# Patient Record
Sex: Female | Born: 1970 | Race: White | Hispanic: No | Marital: Married | Smoking: Never smoker
Health system: Southern US, Community
[De-identification: ages and names within clinical notes are randomized; demographics above are authoritative.]

## PROBLEM LIST (undated history)

## (undated) DIAGNOSIS — K219 Gastro-esophageal reflux disease without esophagitis: Secondary | ICD-10-CM

## (undated) DIAGNOSIS — T4145XA Adverse effect of unspecified anesthetic, initial encounter: Secondary | ICD-10-CM

## (undated) DIAGNOSIS — Z9889 Other specified postprocedural states: Secondary | ICD-10-CM

## (undated) DIAGNOSIS — R112 Nausea with vomiting, unspecified: Secondary | ICD-10-CM

## (undated) DIAGNOSIS — T8859XA Other complications of anesthesia, initial encounter: Secondary | ICD-10-CM

## (undated) DIAGNOSIS — L299 Pruritus, unspecified: Secondary | ICD-10-CM

## (undated) DIAGNOSIS — E663 Overweight: Secondary | ICD-10-CM

## (undated) HISTORY — DX: Overweight: E66.3

## (undated) HISTORY — PX: OTHER SURGICAL HISTORY: SHX169

## (undated) HISTORY — DX: Pruritus, unspecified: L29.9

---

## 2000-08-06 ENCOUNTER — Other Ambulatory Visit: Admission: RE | Admit: 2000-08-06 | Discharge: 2000-08-06 | Payer: Self-pay | Admitting: Gynecology

## 2001-08-20 ENCOUNTER — Other Ambulatory Visit: Admission: RE | Admit: 2001-08-20 | Discharge: 2001-08-20 | Payer: Self-pay | Admitting: Gynecology

## 2001-10-15 HISTORY — PX: BREAST ENHANCEMENT SURGERY: SHX7

## 2002-09-09 ENCOUNTER — Other Ambulatory Visit: Admission: RE | Admit: 2002-09-09 | Discharge: 2002-09-09 | Payer: Self-pay | Admitting: Gynecology

## 2004-04-10 ENCOUNTER — Other Ambulatory Visit: Admission: RE | Admit: 2004-04-10 | Discharge: 2004-04-10 | Payer: Self-pay | Admitting: Gynecology

## 2005-05-16 ENCOUNTER — Other Ambulatory Visit: Admission: RE | Admit: 2005-05-16 | Discharge: 2005-05-16 | Payer: Self-pay | Admitting: Gynecology

## 2006-06-03 ENCOUNTER — Other Ambulatory Visit: Admission: RE | Admit: 2006-06-03 | Discharge: 2006-06-03 | Payer: Self-pay | Admitting: Gynecology

## 2006-11-18 ENCOUNTER — Encounter: Admission: RE | Admit: 2006-11-18 | Discharge: 2006-11-18 | Payer: Self-pay | Admitting: Gynecology

## 2007-09-29 ENCOUNTER — Other Ambulatory Visit: Admission: RE | Admit: 2007-09-29 | Discharge: 2007-09-29 | Payer: Self-pay | Admitting: Gynecology

## 2012-08-12 ENCOUNTER — Other Ambulatory Visit: Payer: Self-pay | Admitting: Gynecology

## 2012-08-12 DIAGNOSIS — Z9882 Breast implant status: Secondary | ICD-10-CM

## 2012-08-12 DIAGNOSIS — Z1231 Encounter for screening mammogram for malignant neoplasm of breast: Secondary | ICD-10-CM

## 2012-11-06 ENCOUNTER — Ambulatory Visit
Admission: RE | Admit: 2012-11-06 | Discharge: 2012-11-06 | Disposition: A | Discharge status: Home / Self Care | Payer: 59 | Source: Ambulatory Visit | Attending: Gynecology | Admitting: Gynecology

## 2012-11-06 DIAGNOSIS — Z9882 Breast implant status: Secondary | ICD-10-CM

## 2012-11-06 DIAGNOSIS — Z1231 Encounter for screening mammogram for malignant neoplasm of breast: Secondary | ICD-10-CM

## 2016-06-28 LAB — HM PAP SMEAR: HM Pap smear: NEGATIVE

## 2017-04-16 ENCOUNTER — Ambulatory Visit: Payer: Self-pay | Admitting: Physician Assistant

## 2017-04-16 DIAGNOSIS — Z0189 Encounter for other specified special examinations: Secondary | ICD-10-CM

## 2017-04-19 ENCOUNTER — Encounter: Payer: Self-pay | Admitting: Physician Assistant

## 2017-04-19 ENCOUNTER — Ambulatory Visit (INDEPENDENT_AMBULATORY_CARE_PROVIDER_SITE_OTHER): Payer: 59

## 2017-04-19 ENCOUNTER — Ambulatory Visit (INDEPENDENT_AMBULATORY_CARE_PROVIDER_SITE_OTHER): Payer: 59 | Admitting: Physician Assistant

## 2017-04-19 VITALS — BP 121/84 | HR 91 | Ht 66.0 in | Wt 179.0 lb

## 2017-04-19 DIAGNOSIS — R143 Flatulence: Secondary | ICD-10-CM | POA: Diagnosis not present

## 2017-04-19 DIAGNOSIS — Z13220 Encounter for screening for lipoid disorders: Secondary | ICD-10-CM | POA: Diagnosis not present

## 2017-04-19 DIAGNOSIS — R740 Nonspecific elevation of levels of transaminase and lactic acid dehydrogenase [LDH]: Secondary | ICD-10-CM | POA: Diagnosis not present

## 2017-04-19 DIAGNOSIS — Z7689 Persons encountering health services in other specified circumstances: Secondary | ICD-10-CM | POA: Diagnosis not present

## 2017-04-19 DIAGNOSIS — R5383 Other fatigue: Secondary | ICD-10-CM

## 2017-04-19 DIAGNOSIS — IMO0001 Reserved for inherently not codable concepts without codable children: Secondary | ICD-10-CM | POA: Insufficient documentation

## 2017-04-19 DIAGNOSIS — L299 Pruritus, unspecified: Secondary | ICD-10-CM | POA: Insufficient documentation

## 2017-04-19 DIAGNOSIS — R030 Elevated blood-pressure reading, without diagnosis of hypertension: Secondary | ICD-10-CM | POA: Insufficient documentation

## 2017-04-19 DIAGNOSIS — R0989 Other specified symptoms and signs involving the circulatory and respiratory systems: Secondary | ICD-10-CM

## 2017-04-19 DIAGNOSIS — F458 Other somatoform disorders: Secondary | ICD-10-CM | POA: Diagnosis not present

## 2017-04-19 DIAGNOSIS — R7401 Elevation of levels of liver transaminase levels: Secondary | ICD-10-CM

## 2017-04-19 LAB — CBC
HCT: 42.2 % (ref 35.0–45.0)
Hemoglobin: 14.1 g/dL (ref 11.7–15.5)
MCH: 30.9 pg (ref 27.0–33.0)
MCHC: 33.4 g/dL (ref 32.0–36.0)
MCV: 92.3 fL (ref 80.0–100.0)
MPV: 10.4 fL (ref 7.5–12.5)
PLATELETS: 345 10*3/uL (ref 140–400)
RBC: 4.57 MIL/uL (ref 3.80–5.10)
RDW: 13 % (ref 11.0–15.0)
WBC: 8.1 10*3/uL (ref 3.8–10.8)

## 2017-04-19 NOTE — Patient Instructions (Addendum)
-   Plan to go downstairs today for labs and x-ray today. We will contact you within 3-5 days with your results, or sooner if anything requires immediate attention  For your blood pressure: - Check blood pressure at home for the next 2 weeks - Check around the same time each day in a relaxed setting - Limit salt. Follow DASH eating plan - Follow-up in 2 weeks. Bring your BP log and monitor - Avoid stimulants, including phentermine  Sleep Hygiene . Limiting daytime naps to 30 minutes . Napping does not make up for inadequate nighttime sleep. However, a short nap of 20-30 minutes can help to improve mood, alertness and performance.  . Avoiding stimulants such as  caffeine and phentermine and nicotine close to bedtime.  And when it comes to alcohol, moderation is key 4. While alcohol is well-known to help you fall asleep faster, too much close to bedtime can disrupt sleep in the second half of the night as the body begins to process the alcohol.    . Exercising to promote good quality sleep.  As little as 10 minutes of aerobic exercise, such as walking or cycling, can drastically improve nighttime sleep quality.  For the best night's sleep, most people should avoid strenuous workouts close to bedtime. However, the effect of intense nighttime exercise on sleep differs from person to person, so find out what works best for you.   . Steering clear of food that can be disruptive right before sleep.   Heavy or rich foods, fatty or fried meals, spicy dishes, citrus fruits, and carbonated drinks can trigger indigestion for some people. When this occurs close to bedtime, it can lead to painful heartburn that disrupts sleep. . Ensuring adequate exposure to natural light.  This is particularly important for individuals who may not venture outside frequently. Exposure to sunlight during the day, as well as darkness at night, helps to maintain a healthy sleep-wake cycle . Marland Kitchen Establishing a regular relaxing bedtime  routine.  A regular nightly routine helps the body recognize that it is bedtime. This could include taking warm shower or bath, reading a book, or light stretches. When possible, try to avoid emotionally upsetting conversations and activities before attempting to sleep. . Making sure that the sleep environment is pleasant.  Mattress and pillows should be comfortable. The bedroom should be cool - between 60 and 67 degrees - for optimal sleep. Bright light from lamps, cell phone and TV screens can make it difficult to fall asleep4, so turn those light off or adjust them when possible. Consider using blackout curtains, eye shades, ear plugs, "white noise" machines, humidifiers, fans and other devices that can make the bedroom more relaxing.

## 2017-04-19 NOTE — Progress Notes (Signed)
HPI:                                                                Desiree Price is a 46 y.o. female who presents to [place]: [place] today to establish care  Current Concerns include globus sensation, fatigue  Reports intermittently feels like food is getting stuck in her lower throat. This has been going on for a few months. Denies dyspepsia, dysphagia, belching, regurgitation or aspiration.  Pruritis: reports chronic itching of her arms. Worse with heat. Followed by dermatology. Has been prescribed Gabapentin, but does not take it. Takes Benadryl daily.  Overweight: takes Phentermine intermittently. Prescribed by Bariatric clinic.  Fatigue: patient reports feeling more tired than her baseline lately. She currently sleeps about 4-5 hours per night, going to bed between 11p-12a and waking at 4a for work during the week. 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  Health Maintenance Health Maintenance  Topic Date Due  . HIV Screening  04/13/1985  . TETANUS/TDAP  04/13/1989  . PAP SMEAR  04/14/1991  . INFLUENZA VACCINE  05/15/2017    GYN/Sexual Health  Menstrual status: having periods  LMP: 03/29/17  Menses: regular  Last pap smear: 05/26/15, normal per patient, Lorenda Ishihara, NP [facility]  Sexually active: yes  Current contraception: no   Past Medical History:  Diagnosis Date  . Overweight   . Pruritus    Past Surgical History:  Procedure Laterality Date  . BREAST ENHANCEMENT SURGERY  2003   Social History  Substance Use Topics  . Smoking status: Never Smoker  . Smokeless tobacco: Never Used  . Alcohol use Yes   family history includes CAD in her father; Diabetes in her father, maternal grandfather, paternal grandmother, and sister; Heart disease in her father; Hyperlipidemia in her father; Hypertension in her father, mother, and sister.  ROS: Review of Systems  Constitutional: Positive for  malaise/fatigue. Negative for chills, fever and weight loss.  HENT: Negative.   Respiratory: Negative.   Cardiovascular: Negative.   Gastrointestinal: Positive for heartburn and nausea.  Genitourinary: Negative.   Musculoskeletal: Negative.   Skin: Negative.   Neurological: Positive for dizziness (intermittent w/vigorous exercise) and headaches.  Psychiatric/Behavioral: The patient has insomnia.      Medications: Current Outpatient Prescriptions  Medication Sig Dispense Refill  . Diphenhydramine-Pseudoephed (BENADRYL ALLERGY/SINUS PO) Take by mouth as needed.     . phentermine (ADIPEX-P) 37.5 MG tablet Take 37.5 mg by mouth daily before breakfast.     No current facility-administered medications for this visit.    Allergies  Allergen Reactions  . No Known Allergies        Objective:  BP 121/84   Pulse 91   Ht 5\' 6"  (1.676 m)   Wt 179 lb (81.2 kg)   LMP 03/29/2017   BMI 28.89 kg/m  Gen: well-groomed, cooperative, not ill-appearing, no distress HEENT: normal conjunctiva, oropharynx clear, uvula midline, moist mucus membranes, no thyromegaly or tenderness Pulm: Normal work of breathing, normal phonation, clear to auscultation bilaterally CV: Normal rate, regular rhythm, s1 and s2 distinct, no murmurs, clicks or rubs, no carotid bruit GI: abdomen soft, nondistended, nontender, no masses Neuro: alert and oriented x 3, EOM's intact, PERRLA, normal tone, no tremor  MSK: extremities atraumatic, normal gait and station, no peripheral edema Skin: warm and dry, no rashes or lesions on exposed skin Psych: anxious affect, euthymic mood, normal speech and thought content     Depression screen [facility] 2/9 04/19/2017  Decreased Interest 0  Down, Depressed, Hopeless 0  PHQ - 2 Score 0  Altered sleeping 3  Tired, decreased energy 1  Change in appetite 0  Feeling bad or failure about yourself  0  Trouble concentrating 0  Moving slowly or fidgety/restless 0  Suicidal thoughts 0   PHQ-9 Score 4     Assessment and Plan: 46 y.o. female with   1. Encounter to establish care - reviewed PMH - negative PHQ2/depression screen  2. Pruritus - cont to follow-up with dermatology - cont Benadryl prn - checking liver enzymes today  3. Fatigue, unspecified type - CBC - Comprehensive metabolic panel - TSH  4. Encounter for screening for lipid disorder - Lipid Panel w/reflex Direct LDL  5. Globus sensation - DG Abd Acute W/Chest; Future  6. Elevated blood pressure reading - patient to monitor BP's at home x 2 weeks and log readings - DASH eating plan - avoid stimulants, including Phentermine   Patient education and anticipatory guidance given Patient agrees with treatment plan Follow-up in 2 weeks for blood pressure or sooner as needed  Darlyne Russian PA-C

## 2017-04-20 LAB — COMPREHENSIVE METABOLIC PANEL
ALT: 37 U/L — ABNORMAL HIGH (ref 6–29)
AST: 23 U/L (ref 10–35)
Albumin: 4.4 g/dL (ref 3.6–5.1)
Alkaline Phosphatase: 83 U/L (ref 33–115)
BUN: 13 mg/dL (ref 7–25)
CALCIUM: 9.7 mg/dL (ref 8.6–10.2)
CHLORIDE: 103 mmol/L (ref 98–110)
CO2: 25 mmol/L (ref 20–31)
Creat: 1.08 mg/dL (ref 0.50–1.10)
GLUCOSE: 87 mg/dL (ref 65–99)
POTASSIUM: 4.7 mmol/L (ref 3.5–5.3)
Sodium: 139 mmol/L (ref 135–146)
TOTAL PROTEIN: 7 g/dL (ref 6.1–8.1)
Total Bilirubin: 0.4 mg/dL (ref 0.2–1.2)

## 2017-04-20 LAB — LIPID PANEL W/REFLEX DIRECT LDL
CHOL/HDL RATIO: 3.8 ratio (ref <5.0)
Cholesterol: 223 mg/dL — ABNORMAL HIGH (ref <200)
HDL: 59 mg/dL (ref >50)
LDL-Cholesterol: 137 mg/dL — ABNORMAL HIGH
Non-HDL Cholesterol (Calc): 164 mg/dL — ABNORMAL HIGH (ref <130)
TRIGLYCERIDES: 141 mg/dL (ref <150)

## 2017-04-20 LAB — TSH: TSH: 1.05 m[IU]/L

## 2017-04-21 ENCOUNTER — Encounter: Payer: Self-pay | Admitting: Physician Assistant

## 2017-04-21 DIAGNOSIS — R5383 Other fatigue: Secondary | ICD-10-CM | POA: Insufficient documentation

## 2017-04-22 ENCOUNTER — Telehealth: Payer: Self-pay | Admitting: *Deleted

## 2017-04-22 ENCOUNTER — Encounter: Payer: Self-pay | Admitting: Physician Assistant

## 2017-04-22 DIAGNOSIS — R7401 Elevation of levels of liver transaminase levels: Secondary | ICD-10-CM | POA: Insufficient documentation

## 2017-04-22 DIAGNOSIS — R740 Nonspecific elevation of levels of transaminase and lactic acid dehydrogenase [LDH]: Secondary | ICD-10-CM

## 2017-04-22 DIAGNOSIS — E7800 Pure hypercholesterolemia, unspecified: Secondary | ICD-10-CM | POA: Insufficient documentation

## 2017-04-22 NOTE — Telephone Encounter (Signed)
Patient should trial 1 month of over-the-counter Omeprazole (Prilosec) 20mg  daily. Follow-up in 1 month

## 2017-04-22 NOTE — Addendum Note (Signed)
Addended by: Nelson Chimes E on: 04/22/2017 10:50 AM   Modules accepted: Orders

## 2017-04-22 NOTE — Telephone Encounter (Signed)
Patient wanted Evlyn Clines to know that after she thought about it she does feel like she has been burping a lot more and does still feel nauseous. She wonders if she may have GERD. Please advise

## 2017-04-22 NOTE — Telephone Encounter (Signed)
Pt informed of recommendations.Desiree Price Lynetta  

## 2017-04-22 NOTE — Progress Notes (Signed)
Your labs and x-ray look good overall - there is no evidence of a hernia or abnormality in the chest - cholesterol is borderline high. Continue to exercise regularly and limit saturated fats. DASH eating plan. - liver enzyme is mildly elevated. This is most likely a little fat in the liver. We will recheck it in 4 weeks  Otherwise normal - normal kidney function - normal blood counts - no evidence of diabetes - no evidence of thyroid disease

## 2017-04-24 ENCOUNTER — Telehealth: Payer: Self-pay | Admitting: *Deleted

## 2017-04-24 ENCOUNTER — Encounter: Payer: Self-pay | Admitting: Physician Assistant

## 2017-04-24 ENCOUNTER — Other Ambulatory Visit: Payer: Self-pay | Admitting: *Deleted

## 2017-04-24 DIAGNOSIS — R7401 Elevation of levels of liver transaminase levels: Secondary | ICD-10-CM

## 2017-04-24 DIAGNOSIS — R740 Nonspecific elevation of levels of transaminase and lactic acid dehydrogenase [LDH]: Principal | ICD-10-CM

## 2017-04-24 NOTE — Telephone Encounter (Signed)
Patient called yesterday stating she went to [place] (urgent care?) and wanted a doctor her to review the progress notes she received from them. From the message she left she may be wanting a second opinion. Called the patient and left a vm stating she would need to call and schedule an appointment if she saw another provider and wanted [place] to give another opinion and if she is still not feeling well.

## 2017-04-25 ENCOUNTER — Encounter: Payer: Self-pay | Admitting: Physician Assistant

## 2017-04-25 LAB — COMPREHENSIVE METABOLIC PANEL
ALT: 63 U/L — ABNORMAL HIGH (ref 6–29)
AST: 33 U/L (ref 10–35)
Albumin: 4.2 g/dL (ref 3.6–5.1)
Alkaline Phosphatase: 91 U/L (ref 33–115)
BUN: 11 mg/dL (ref 7–25)
CHLORIDE: 103 mmol/L (ref 98–110)
CO2: 23 mmol/L (ref 20–31)
Calcium: 9.2 mg/dL (ref 8.6–10.2)
Creat: 0.91 mg/dL (ref 0.50–1.10)
GLUCOSE: 87 mg/dL (ref 65–99)
POTASSIUM: 4 mmol/L (ref 3.5–5.3)
Sodium: 137 mmol/L (ref 135–146)
Total Bilirubin: 0.3 mg/dL (ref 0.2–1.2)
Total Protein: 6.7 g/dL (ref 6.1–8.1)

## 2017-04-25 NOTE — Progress Notes (Signed)
Lab cannot be added. Pt instructed to return to lab for further testing.

## 2017-04-25 NOTE — Progress Notes (Signed)
Hi Desiree Price,  Good news, those prior lab abnormalities were not present on your repeat CMP. All of your electrolytes, including potassium, are in a normal range.  Your ALT, which is a liver enzyme, is a little elevated. I am going to order a little more blood work and an ultrasound of your liver. Most often the cause of this is just some fat that has infiltrated the liver and the treatment for that is heart-healthy diet and exercise to achieve a healthy body weight.  Desiree Price

## 2017-05-01 ENCOUNTER — Encounter: Payer: Self-pay | Admitting: Physician Assistant

## 2017-05-03 ENCOUNTER — Ambulatory Visit (INDEPENDENT_AMBULATORY_CARE_PROVIDER_SITE_OTHER): Payer: 59 | Admitting: Physician Assistant

## 2017-05-03 ENCOUNTER — Encounter: Payer: Self-pay | Admitting: Physician Assistant

## 2017-05-03 VITALS — BP 132/86 | HR 99 | Ht 66.0 in | Wt 186.0 lb

## 2017-05-03 DIAGNOSIS — R740 Nonspecific elevation of levels of transaminase and lactic acid dehydrogenase [LDH]: Secondary | ICD-10-CM

## 2017-05-03 DIAGNOSIS — M79671 Pain in right foot: Secondary | ICD-10-CM

## 2017-05-03 DIAGNOSIS — M79672 Pain in left foot: Secondary | ICD-10-CM | POA: Diagnosis not present

## 2017-05-03 DIAGNOSIS — I10 Essential (primary) hypertension: Secondary | ICD-10-CM | POA: Diagnosis not present

## 2017-05-03 DIAGNOSIS — M722 Plantar fascial fibromatosis: Secondary | ICD-10-CM | POA: Insufficient documentation

## 2017-05-03 DIAGNOSIS — R7401 Elevation of levels of liver transaminase levels: Secondary | ICD-10-CM

## 2017-05-03 NOTE — Patient Instructions (Addendum)
For blood pressure: - continue to monitor intermittently - DASH eating plan - at least 3 days per week of moderate intensity cardio - follow-up in 6 months   Schedule your ultrasound [phone] (Imaging Dept on the 1st floor)  For your feet: - rehab exercises daily - avoid jumping and high impact activities that jar your heel - make an appointment with Sports Medicine for custom orthotics. Bring your work Copy  DASH Eating Plan DASH stands for "Dietary Approaches to Stop Hypertension." The DASH eating plan is a healthy eating plan that has been shown to reduce high blood pressure (hypertension). It may also reduce your risk for type 2 diabetes, heart disease, and stroke. The DASH eating plan may also help with weight loss. What are tips for following this plan? General guidelines  Avoid eating more than 2,300 mg (milligrams) of salt (sodium) a day. If you have hypertension, you may need to reduce your sodium intake to 1,500 mg a day.  Limit alcohol intake to no more than 1 drink a day for nonpregnant women and 2 drinks a day for men. One drink equals 12 oz of beer, 5 oz of wine, or 1 oz of hard liquor.  Work with your health care provider to maintain a healthy body weight or to lose weight. Ask what an ideal weight is for you.  Get at least 30 minutes of exercise that causes your heart to beat faster (aerobic exercise) most days of the week. Activities may include walking, swimming, or biking.  Work with your health care provider or diet and nutrition specialist (dietitian) to adjust your eating plan to your individual calorie needs. Reading food labels  Check food labels for the amount of sodium per serving. Choose foods with less than 5 percent of the Daily Value of sodium. Generally, foods with less than 300 mg of sodium per serving fit into this eating plan.  To find whole grains, look for the word "whole" as the first word in the ingredient  list. Shopping  Buy products labeled as "low-sodium" or "no salt added."  Buy fresh foods. Avoid canned foods and premade or frozen meals. Cooking  Avoid adding salt when cooking. Use salt-free seasonings or herbs instead of table salt or sea salt. Check with your health care provider or pharmacist before using salt substitutes.  Do not fry foods. Cook foods using healthy methods such as baking, boiling, grilling, and broiling instead.  Cook with heart-healthy oils, such as olive, canola, soybean, or sunflower oil. Meal planning   Eat a balanced diet that includes: ? 5 or more servings of fruits and vegetables each day. At each meal, try to fill half of your plate with fruits and vegetables. ? Up to 6-8 servings of whole grains each day. ? Less than 6 oz of lean meat, poultry, or fish each day. A 3-oz serving of meat is about the same size as a deck of cards. One egg equals 1 oz. ? 2 servings of low-fat dairy each day. ? A serving of nuts, seeds, or beans 5 times each week. ? Heart-healthy fats. Healthy fats called Omega-3 fatty acids are found in foods such as flaxseeds and coldwater fish, like sardines, salmon, and mackerel.  Limit how much you eat of the following: ? Canned or prepackaged foods. ? Food that is high in trans fat, such as fried foods. ? Food that is high in saturated fat, such as fatty meat. ? Sweets, desserts, sugary drinks, and other  foods with added sugar. ? Full-fat dairy products.  Do not salt foods before eating.  Try to eat at least 2 vegetarian meals each week.  Eat more home-cooked food and less restaurant, buffet, and fast food.  When eating at a restaurant, ask that your food be prepared with less salt or no salt, if possible. What foods are recommended? The items listed may not be a complete list. Talk with your dietitian about what dietary choices are best for you. Grains Whole-grain or whole-wheat bread. Whole-grain or whole-wheat pasta. Brown  rice. [place]. Bulgur. Whole-grain and low-sodium cereals. Pita bread. Low-fat, low-sodium crackers. Whole-wheat flour tortillas. Vegetables Fresh or frozen vegetables (raw, steamed, roasted, or grilled). Low-sodium or reduced-sodium tomato and vegetable juice. Low-sodium or reduced-sodium tomato sauce and tomato paste. Low-sodium or reduced-sodium canned vegetables. Fruits All fresh, dried, or frozen fruit. Canned fruit in natural juice (without added sugar). Meat and other protein foods Skinless chicken or [place]. Ground chicken or [place]. Pork with fat trimmed off. Fish and seafood. Egg whites. Dried beans, peas, or lentils. Unsalted nuts, nut butters, and seeds. Unsalted canned beans. Lean cuts of beef with fat trimmed off. Low-sodium, lean deli meat. Dairy Low-fat (1%) or fat-free (skim) milk. Fat-free, low-fat, or reduced-fat cheeses. Nonfat, low-sodium ricotta or cottage cheese. Low-fat or nonfat yogurt. Low-fat, low-sodium cheese. Fats and oils Soft margarine without trans fats. Vegetable oil. Low-fat, reduced-fat, or light mayonnaise and salad dressings (reduced-sodium). Canola, safflower, olive, soybean, and sunflower oils. Avocado. Seasoning and other foods Herbs. Spices. Seasoning mixes without salt. Unsalted popcorn and pretzels. Fat-free sweets. What foods are not recommended? The items listed may not be a complete list. Talk with your dietitian about what dietary choices are best for you. Grains Baked goods made with fat, such as croissants, muffins, or some breads. Dry pasta or rice meal packs. Vegetables Creamed or fried vegetables. Vegetables in a cheese sauce. Regular canned vegetables (not low-sodium or reduced-sodium). Regular canned tomato sauce and paste (not low-sodium or reduced-sodium). Regular tomato and vegetable juice (not low-sodium or reduced-sodium). Angie Fava. Olives. Fruits Canned fruit in a light or heavy syrup. Fried fruit. Fruit in cream or butter  sauce. Meat and other protein foods Fatty cuts of meat. Ribs. Fried meat. Berniece Salines. Sausage. Bologna and other processed lunch meats. Salami. Fatback. Hotdogs. Bratwurst. Salted nuts and seeds. Canned beans with added salt. Canned or smoked fish. Whole eggs or egg yolks. Chicken or [place] with skin. Dairy Whole or 2% milk, cream, and half-and-half. Whole or full-fat cream cheese. Whole-fat or sweetened yogurt. Full-fat cheese. Nondairy creamers. Whipped toppings. Processed cheese and cheese spreads. Fats and oils Butter. Stick margarine. Lard. Shortening. Ghee. Bacon fat. Tropical oils, such as coconut, palm kernel, or palm oil. Seasoning and other foods Salted popcorn and pretzels. Onion salt, garlic salt, seasoned salt, table salt, and sea salt. Worcestershire sauce. Tartar sauce. Barbecue sauce. Teriyaki sauce. Soy sauce, including reduced-sodium. Steak sauce. Canned and packaged gravies. Fish sauce. Oyster sauce. Cocktail sauce. Horseradish that you find on the shelf. Ketchup. Mustard. Meat flavorings and tenderizers. Bouillon cubes. Hot sauce and Tabasco sauce. Premade or packaged marinades. Premade or packaged taco seasonings. Relishes. Regular salad dressings. Where to find more information:  National Heart, Lung, and [place]: https://wilson-eaton.com/  American Heart Association: www.heart.org Summary  The DASH eating plan is a healthy eating plan that has been shown to reduce high blood pressure (hypertension). It may also reduce your risk for type 2 diabetes, heart disease, and stroke.  With  the DASH eating plan, you should limit salt (sodium) intake to 2,300 mg a day. If you have hypertension, you may need to reduce your sodium intake to 1,500 mg a day.  When on the DASH eating plan, aim to eat more fresh fruits and vegetables, whole grains, lean proteins, low-fat dairy, and heart-healthy fats.  Work with your health care provider or diet and nutrition specialist (dietitian) to adjust  your eating plan to your individual calorie needs. This information is not intended to replace advice given to you by your health care provider. Make sure you discuss any questions you have with your health care provider. Document Released: 09/20/2011 Document Revised: 09/24/2016 Document Reviewed: 09/24/2016 Elsevier Interactive Patient Education  2017 [employer].

## 2017-05-03 NOTE — Progress Notes (Signed)
HPI:                                                                Desiree Price is a 46 y.o. female who presents to [place]: [place] today for blood pressure follow-up  Patient has been monitoring BP's at home. BP range 120's-130's/80's. Denies vision change, headache, chest pain with exertion, orthopnea, lightheadedness, syncope and edema. Risk factors include: obesity  Elevated ALT: denies fatigue, malaise, RUQ pain  Patient also complains of bilateral heel pain present for over 6 months. States it is worse in the morning when she first puts her feet down and gradually improves throughout the day, but never goes away. Pain is moderate, persistent. Patient works on her feet daily for [employer].  Past Medical History:  Diagnosis Date  . Overweight   . Pruritus    Past Surgical History:  Procedure Laterality Date  . BREAST ENHANCEMENT SURGERY  2003   Social History  Substance Use Topics  . Smoking status: Never Smoker  . Smokeless tobacco: Never Used  . Alcohol use Yes   family history includes CAD in her father; Diabetes in her father, maternal grandfather, paternal grandmother, and sister; Heart disease in her father; Hyperlipidemia in her father; Hypertension in her father, mother, and sister.  ROS: negative except as noted in the HPI  Medications: Current Outpatient Prescriptions  Medication Sig Dispense Refill  . Diphenhydramine-Pseudoephed (BENADRYL ALLERGY/SINUS PO) Take by mouth as needed.     . phentermine (ADIPEX-P) 37.5 MG tablet Take 37.5 mg by mouth daily before breakfast.     No current facility-administered medications for this visit.    Allergies  Allergen Reactions  . No Known Allergies        Objective:  BP 132/86   Pulse 99   Ht 5\' 6"  (1.676 m)   Wt 186 lb (84.4 kg)   BMI 30.02 kg/m  Gen: well-groomed, cooperative, not ill-appearing, no distress HEENT: normal conjunctiva, trachea midline Pulm: Normal  work of breathing, normal phonation, clear to auscultation bilaterally, no wheezes, rales or rhonchi CV: Normal rate, regular rhythm, s1 and s2 distinct, no murmurs, clicks or rubs  Neuro: alert and oriented x 3, EOM's intact, no tremor MSK: extremities atraumatic, normal gait and station, no peripheral edema Skin: warm, dry, intact; no rashes on exposed skin, no jaundice   No results found for this or any previous visit (from the past 72 hour(s)). No results found.    Assessment and Plan: 46 y.o. female with  1. Essential hypertension - Stage I hypertension, 10-yr ASCVD risk <10% - cont therapeutic lifestyle changes - will continue to monitor and reassess need for medication every 6 months  2. Elevated ALT measurement - most likely NAFL given obesity and low risk behaviors for hepatitis - RUQ ultrasound pending - acute hepatitis panel pending  3. Heel pain, bilateral - differential includes plantar fasciitis  - rehab exercises daily - follow-up with Sports Medicine for orthotics  Patient education and anticipatory guidance given Patient agrees with treatment plan Follow-up in 6 months or sooner as needed   Darlyne Russian PA-C

## 2017-05-04 LAB — HEPATITIS PANEL, ACUTE
HCV AB: NEGATIVE
Hep A IgM: NONREACTIVE
Hep B C IgM: NONREACTIVE
Hepatitis B Surface Ag: NEGATIVE

## 2017-05-05 ENCOUNTER — Encounter: Payer: Self-pay | Admitting: Physician Assistant

## 2017-05-06 ENCOUNTER — Ambulatory Visit (INDEPENDENT_AMBULATORY_CARE_PROVIDER_SITE_OTHER): Payer: 59

## 2017-05-06 DIAGNOSIS — K8020 Calculus of gallbladder without cholecystitis without obstruction: Secondary | ICD-10-CM

## 2017-05-06 DIAGNOSIS — R740 Nonspecific elevation of levels of transaminase and lactic acid dehydrogenase [LDH]: Principal | ICD-10-CM

## 2017-05-06 DIAGNOSIS — R7401 Elevation of levels of liver transaminase levels: Secondary | ICD-10-CM

## 2017-05-06 NOTE — Progress Notes (Signed)
Hi Bertie,  Good news: your ultrasound showed a normal liver.  It also showed stones in your gallbladder. These are fairly common and do not tend to cause symptoms unless they get lodged in a bile duct.  Otherwise, everything looks normal.  Best, Evlyn Clines

## 2017-05-16 ENCOUNTER — Encounter: Payer: Self-pay | Admitting: Sports Medicine

## 2017-05-16 ENCOUNTER — Ambulatory Visit (INDEPENDENT_AMBULATORY_CARE_PROVIDER_SITE_OTHER): Payer: 59 | Admitting: Sports Medicine

## 2017-05-16 DIAGNOSIS — M722 Plantar fascial fibromatosis: Secondary | ICD-10-CM | POA: Diagnosis not present

## 2017-05-16 NOTE — Assessment & Plan Note (Signed)
Custom orthotics as above, meloxicam, rehabilitation exercises given. Return to see me in one month.

## 2017-05-16 NOTE — Progress Notes (Signed)
   Subjective:    I'm seeing this patient as a consultation for:  Nelson Chimes, PA-C  CC:  Right foot pain  HPI: This is a pleasant 46 year old female, she works for [employer], spends all day on her feet. She is not allowed to wear athletic shoes at work. The past several weeks she's had increasing pain on the plantar aspect of her right heel, worse with first few steps in the morning and at the end of the day, localized without radiation.  Past medical history, Surgical history, Family history not pertinant except as noted below, Social history, Allergies, and medications have been entered into the medical record, reviewed, and no changes needed.   Review of Systems: No headache, visual changes, nausea, vomiting, diarrhea, constipation, dizziness, abdominal pain, skin rash, fevers, chills, night sweats, weight loss, swollen lymph nodes, body aches, joint swelling, muscle aches, chest pain, shortness of breath, mood changes, visual or auditory hallucinations.   Objective:   General: Well Developed, well nourished, and in no acute distress.  Neuro:  Extra-ocular muscles intact, able to move all 4 extremities, sensation grossly intact.  Deep tendon reflexes tested were normal. Psych: Alert and oriented, mood congruent with affect. ENT:  Ears and nose appear unremarkable.  Hearing grossly normal. Neck: Unremarkable overall appearance, trachea midline.  No visible thyroid enlargement. Eyes: Conjunctivae and lids appear unremarkable.  Pupils equal and round. Skin: Warm and dry, no rashes noted.  Cardiovascular: Pulses palpable, no extremity edema. Right Foot: No visible erythema or swelling. Range of motion is full in all directions. Strength is 5/5 in all directions. No hallux valgus. Pes cavus No abnormal callus noted. No pain over the navicular prominence, or base of fifth metatarsal. Moderate tenderness to palpation of the calcaneal insertion of plantar fascia. No pain at the Achilles  insertion. No pain over the calcaneal bursa. No pain of the retrocalcaneal bursa. No tenderness to palpation over the tarsals, metatarsals, or phalanges. No hallux rigidus or limitus. No tenderness palpation over interphalangeal joints. No pain with compression of the metatarsal heads. Neurovascularly intact distally.  Patient was fitted for a : standard, cushioned, semi-rigid orthotic. The orthotic was heated and afterward the patient stood on the orthotic blank positioned on the orthotic stand. The patient was positioned in subtalar neutral position and 10 degrees of ankle dorsiflexion in a weight bearing stance. After completion of molding, a stable base was applied to the orthotic blank. The blank was ground to a stable position for weight bearing. Size: 9 Base: White Health and safety inspector and Padding: None The patient ambulated these, and they were very comfortable.  Impression and Recommendations:   This case required medical decision making of moderate complexity.  Plantar fasciitis, right Custom orthotics as above, meloxicam, rehabilitation exercises given. Return to see me in one month.

## 2017-06-13 ENCOUNTER — Ambulatory Visit: Payer: 59 | Admitting: Sports Medicine

## 2017-06-13 DIAGNOSIS — Z0189 Encounter for other specified special examinations: Secondary | ICD-10-CM

## 2017-07-08 ENCOUNTER — Encounter: Payer: Self-pay | Admitting: Physician Assistant

## 2017-07-08 DIAGNOSIS — R112 Nausea with vomiting, unspecified: Secondary | ICD-10-CM

## 2017-07-23 ENCOUNTER — Ambulatory Visit: Payer: Self-pay | Admitting: Surgery

## 2017-07-24 NOTE — Patient Instructions (Addendum)
Desiree Price  07/24/2017   Your procedure is scheduled on: 07/30/17  Report to [facility] Main  Entrance Take [place]  elevators to 3rd floor to  [place] at    [phone].     Call this number if you have problems the morning of surgery [phone]    Remember: ONLY 1 PERSON MAY GO WITH YOU TO SHORT STAY TO GET  READY MORNING OF YOUR SURGERY.  Do not eat food or drink liquids :After Midnight.     Take these medicines the morning of surgery with A SIP OF WATER: NONE                                You may not have any metal on your body including hair pins and              piercings  Do not wear jewelry, make-up, lotions, powders or perfumes, deodorant             Do not wear nail polish.  Do not shave  48 hours prior to surgery.              Do not bring valuables to the hospital. [place].  Contacts, dentures or bridgework may not be worn into surgery.      Patients discharged the Desiree of surgery will not be allowed to drive home.  Name and phone number of your driver:  Special Instructions: N/A              Please read over the following fact sheets you were given: _____________________________________________________________________         [facility] - Preparing for Surgery Before surgery, you can play an important role.  Because skin is not sterile, your skin needs to be as free of germs as possible.  You can reduce the number of germs on your skin by washing with CHG (chlorahexidine gluconate) soap before surgery.  CHG is an antiseptic cleaner which kills germs and bonds with the skin to continue killing germs even after washing. Please DO NOT use if you have an allergy to CHG or antibacterial soaps.  If your skin becomes reddened/irritated stop using the CHG and inform your nurse when you arrive at Short Stay. Do not shave (including legs and underarms) for at least 48 hours prior to the first  CHG shower.  You may shave your face/neck. Please follow these instructions carefully:  1.  Shower with CHG Soap the night before surgery and the  morning of Surgery.  2.  If you choose to wash your hair, wash your hair first as usual with your  normal  shampoo.  3.  After you shampoo, rinse your hair and body thoroughly to remove the  shampoo.                           4.  Use CHG as you would any other liquid soap.  You can apply chg directly  to the skin and wash                       Gently with a scrungie or clean washcloth.  5.  Apply the CHG Soap to your body ONLY FROM THE NECK DOWN.   Do not use on face/ open                           Wound or open sores. Avoid contact with eyes, ears mouth and genitals (private parts).                       Wash face,  Genitals (private parts) with your normal soap.             6.  Wash thoroughly, paying special attention to the area where your surgery  will be performed.  7.  Thoroughly rinse your body with warm water from the neck down.  8.  DO NOT shower/wash with your normal soap after using and rinsing off  the CHG Soap.                9.  Pat yourself dry with a clean towel.            10.  Wear clean pajamas.            11.  Place clean sheets on your bed the night of your first shower and do not  sleep with pets. Desiree of Surgery : Do not apply any lotions/deodorants the morning of surgery.  Please wear clean clothes to the hospital/surgery center.  FAILURE TO FOLLOW THESE INSTRUCTIONS MAY RESULT IN THE CANCELLATION OF YOUR SURGERY PATIENT SIGNATURE_________________________________  NURSE SIGNATURE__________________________________  ________________________________________________________________________    CLEAR LIQUID DIET   Foods Allowed                                                                     Foods Excluded  Coffee and tea, regular and decaf                             liquids that you cannot  Plain Jell-O in any flavor                                              see through such as: Fruit ices (not with fruit pulp)                                     milk, soups, orange juice  Iced Popsicles                                    All solid food Carbonated beverages, regular and diet                                    Cranberry, grape and apple juices Sports drinks like Gatorade Lightly seasoned clear broth or consume(fat free) Sugar, honey syrup  Sample Menu Breakfast  Lunch                                     Supper Cranberry juice                    Beef broth                            Chicken broth Jell-O                                     Grape juice                           Apple juice Coffee or tea                        Jell-O                                      Popsicle                                                Coffee or tea                        Coffee or tea  _____________________________________________________________________

## 2017-07-26 ENCOUNTER — Encounter (HOSPITAL_COMMUNITY): Payer: Self-pay

## 2017-07-26 ENCOUNTER — Encounter (HOSPITAL_COMMUNITY)
Admission: RE | Admit: 2017-07-26 | Discharge: 2017-07-26 | Disposition: A | Discharge status: Home / Self Care | Payer: 59 | Source: Ambulatory Visit | Attending: Surgery | Admitting: Surgery

## 2017-07-26 DIAGNOSIS — Z01810 Encounter for preprocedural cardiovascular examination: Secondary | ICD-10-CM | POA: Insufficient documentation

## 2017-07-26 DIAGNOSIS — Z01812 Encounter for preprocedural laboratory examination: Secondary | ICD-10-CM | POA: Insufficient documentation

## 2017-07-26 HISTORY — DX: Gastro-esophageal reflux disease without esophagitis: K21.9

## 2017-07-26 HISTORY — DX: Adverse effect of unspecified anesthetic, initial encounter: T41.45XA

## 2017-07-26 HISTORY — DX: Other specified postprocedural states: Z98.890

## 2017-07-26 HISTORY — DX: Other complications of anesthesia, initial encounter: T88.59XA

## 2017-07-26 HISTORY — DX: Nausea with vomiting, unspecified: R11.2

## 2017-07-26 LAB — PREGNANCY, URINE: Preg Test, Ur: NEGATIVE

## 2017-07-26 NOTE — Progress Notes (Signed)
EKG requested from [facility] in [place]. They tried several times to fax . Unsuccessful transaction

## 2017-07-26 NOTE — Progress Notes (Signed)
Cbc 07/03/17 care everywhere Cmp 07/22/17 care everywhere  Dg abd w/ chest 04/19/17 epic

## 2017-07-29 NOTE — H&P (Signed)
Desiree Price  07/23/2017 11:41 AM  Location: [facility]  Patient #: [id number]  DOB: 09/24/1971  Undefined / Language: Desiree Price / Race: White  Female      History of Present Illness Rodman Key B. Hassell Done MD; 07/23/2017 12:12 PM)  The patient is a 46 year old female who presents for evaluation of gall stones. [name] (wife of my patient Desiree Price) has been sick for about 5 weeks. 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 This has interfered with her work. She has had nausea with vomiting and reflux symptoms. She has been worked up for a globus sensation and at one point was referred to the [employer]. When she has an attack she has pain, chills and sweats. No prior abdominal surgery except liposuction.     Ultrasound shows gallstones and I think there may be some wall thickening (in July). Scan and report reviewed by me.     I have discussed lap chole IOC with her in detail including complications not limited to common bile duct injury etc. I gave her a pamphlet on this and she would like to get this done as soon as possible. Will schedule at [place] [name], CMA; 07/23/2017 11:41 AM)  No Known Drug Allergies 07/23/2017    Medication History ([name], CMA; 07/23/2017 11:41 AM)  Ondansetron (4MG  Tablet Disint, Oral) Active.  Pantoprazole Sodium (40MG  Tablet DR, Oral) Active.  Medications Reconciled    Vitals ([name] CMA; 07/23/2017 11:41 AM)  07/23/2017 11:41 AM  Weight: 191.6 lb Height: 66in  Body Surface Area: 1.96 m Body Mass Index: 30.92 kg/m   Temp.: 98.77F(Oral)  Pulse: 96 (Regular)   BP: 120/78 (Sitting, Left Arm, Standard)              Physical Exam ([name] B. Hassell Done MD; 07/23/2017 12:07 PM)  General  Note: HEENT no scleral icterus  Neck supple without adenopathy  Chest clear  Heart SR without murmurs  Abdomen nontender at present  Ext FROM   Neuro alert and oriented x 3          Assessment & Plan Rodman Key B. Hassell Done MD; 07/23/2017 12:10 PM)  CHRONIC CHOLECYSTITIS WITH CALCULUS (K80.10)  Impression: Symptoms likely chronic cholecystitis. Will proceed with lap chole at [facility] asap. She understands the reasons for surgery and the risks/benefits. Her mother (my patient) had common duct stones.  Matt B. Hassell Done, MD, FACS

## 2017-07-30 ENCOUNTER — Ambulatory Visit (HOSPITAL_COMMUNITY): Payer: 59 | Admitting: Anesthesiology

## 2017-07-30 ENCOUNTER — Encounter (HOSPITAL_COMMUNITY): Payer: Self-pay | Admitting: *Deleted

## 2017-07-30 ENCOUNTER — Ambulatory Visit (HOSPITAL_COMMUNITY): Payer: 59

## 2017-07-30 ENCOUNTER — Encounter (HOSPITAL_COMMUNITY)
Admission: RE | Disposition: A | Discharge status: Home / Self Care | Payer: Self-pay | Source: Ambulatory Visit | Attending: Surgery

## 2017-07-30 ENCOUNTER — Ambulatory Visit (HOSPITAL_COMMUNITY)
Admission: RE | Admit: 2017-07-30 | Discharge: 2017-07-30 | Disposition: A | Discharge status: Home / Self Care | Payer: 59 | Source: Ambulatory Visit | Attending: Surgery | Admitting: Surgery

## 2017-07-30 DIAGNOSIS — K219 Gastro-esophageal reflux disease without esophagitis: Secondary | ICD-10-CM | POA: Diagnosis not present

## 2017-07-30 DIAGNOSIS — K8010 Calculus of gallbladder with chronic cholecystitis without obstruction: Secondary | ICD-10-CM | POA: Insufficient documentation

## 2017-07-30 DIAGNOSIS — Z79899 Other long term (current) drug therapy: Secondary | ICD-10-CM | POA: Diagnosis not present

## 2017-07-30 HISTORY — PX: CHOLECYSTECTOMY: SHX55

## 2017-07-30 SURGERY — LAPAROSCOPIC CHOLECYSTECTOMY WITH INTRAOPERATIVE CHOLANGIOGRAM
Anesthesia: General | Site: Abdomen

## 2017-07-30 MED ORDER — FENTANYL CITRATE (PF) 100 MCG/2ML IJ SOLN
INTRAMUSCULAR | Status: AC
  Filled 2017-07-30: qty 2

## 2017-07-30 MED ORDER — CHLORHEXIDINE GLUCONATE CLOTH 2 % EX PADS: 6.0000 | MEDICATED_PAD | Freq: Once | CUTANEOUS | Status: DC

## 2017-07-30 MED ORDER — HYDROCODONE-ACETAMINOPHEN 5-325 MG PO TABS: 1.0000 | ORAL_TABLET | ORAL | 0 refills | Status: AC | PRN

## 2017-07-30 MED ORDER — SODIUM CHLORIDE 0.9 % IV SOLN: 250.0000 mL | INTRAVENOUS | Status: DC | PRN

## 2017-07-30 MED ORDER — ROCURONIUM BROMIDE 10 MG/ML (PF) SYRINGE
PREFILLED_SYRINGE | INTRAVENOUS | Status: DC | PRN
  Administered 2017-07-30: 50 mg via INTRAVENOUS

## 2017-07-30 MED ORDER — DEXAMETHASONE SODIUM PHOSPHATE 10 MG/ML IJ SOLN
INTRAMUSCULAR | Status: AC
  Filled 2017-07-30: qty 1

## 2017-07-30 MED ORDER — DEXAMETHASONE SODIUM PHOSPHATE 10 MG/ML IJ SOLN
INTRAMUSCULAR | Status: DC | PRN
  Administered 2017-07-30: 10 mg via INTRAVENOUS

## 2017-07-30 MED ORDER — HEPARIN SODIUM (PORCINE) 5000 UNIT/ML IJ SOLN
5000.0000 [IU] | Freq: Once | INTRAMUSCULAR | Status: AC
  Administered 2017-07-30: 5000 [IU] via SUBCUTANEOUS
  Filled 2017-07-30: qty 1

## 2017-07-30 MED ORDER — LACTATED RINGERS IV SOLN
INTRAVENOUS | Status: DC
  Administered 2017-07-30 (×2): via INTRAVENOUS

## 2017-07-30 MED ORDER — ACETAMINOPHEN 325 MG PO TABS: 650.0000 mg | ORAL_TABLET | ORAL | Status: DC | PRN

## 2017-07-30 MED ORDER — ONDANSETRON HCL 4 MG/2ML IJ SOLN
INTRAMUSCULAR | Status: DC | PRN
  Administered 2017-07-30: 4 mg via INTRAVENOUS

## 2017-07-30 MED ORDER — ACETAMINOPHEN 500 MG PO TABS
1000.0000 mg | ORAL_TABLET | ORAL | Status: AC
  Administered 2017-07-30: 1000 mg via ORAL
  Filled 2017-07-30: qty 2

## 2017-07-30 MED ORDER — OXYCODONE HCL 5 MG PO TABS: 5.0000 mg | ORAL_TABLET | ORAL | Status: DC | PRN

## 2017-07-30 MED ORDER — LIDOCAINE 2% (20 MG/ML) 5 ML SYRINGE
INTRAMUSCULAR | Status: AC
  Filled 2017-07-30: qty 5

## 2017-07-30 MED ORDER — ACETAMINOPHEN 650 MG RE SUPP
650.0000 mg | RECTAL | Status: DC | PRN
  Filled 2017-07-30: qty 1

## 2017-07-30 MED ORDER — FENTANYL CITRATE (PF) 100 MCG/2ML IJ SOLN
INTRAMUSCULAR | Status: DC | PRN
Start: 2017-07-30 — End: 2017-07-30
  Administered 2017-07-30: 100 ug via INTRAVENOUS
  Administered 2017-07-30 (×3): 50 ug via INTRAVENOUS

## 2017-07-30 MED ORDER — ONDANSETRON HCL 4 MG/2ML IJ SOLN
INTRAMUSCULAR | Status: AC
  Filled 2017-07-30: qty 2

## 2017-07-30 MED ORDER — FENTANYL CITRATE (PF) 250 MCG/5ML IJ SOLN
INTRAMUSCULAR | Status: AC
  Filled 2017-07-30: qty 5

## 2017-07-30 MED ORDER — SCOPOLAMINE 1 MG/3DAYS TD PT72
MEDICATED_PATCH | TRANSDERMAL | Status: AC
  Filled 2017-07-30: qty 1

## 2017-07-30 MED ORDER — SODIUM CHLORIDE 0.9% FLUSH: 3.0000 mL | INTRAVENOUS | Status: DC | PRN

## 2017-07-30 MED ORDER — GABAPENTIN 300 MG PO CAPS
300.0000 mg | ORAL_CAPSULE | ORAL | Status: AC
  Administered 2017-07-30: 300 mg via ORAL
  Filled 2017-07-30: qty 1

## 2017-07-30 MED ORDER — KCL IN DEXTROSE-NACL 20-5-0.45 MEQ/L-%-% IV SOLN: INTRAVENOUS | Status: DC

## 2017-07-30 MED ORDER — SODIUM CHLORIDE 0.9% FLUSH: 3.0000 mL | Freq: Two times a day (BID) | INTRAVENOUS | Status: DC

## 2017-07-30 MED ORDER — PROPOFOL 10 MG/ML IV BOLUS
INTRAVENOUS | Status: DC | PRN
  Administered 2017-07-30: 180 mg via INTRAVENOUS

## 2017-07-30 MED ORDER — LIDOCAINE 2% (20 MG/ML) 5 ML SYRINGE
INTRAMUSCULAR | Status: DC | PRN
  Administered 2017-07-30: 100 mg via INTRAVENOUS

## 2017-07-30 MED ORDER — FENTANYL CITRATE (PF) 100 MCG/2ML IJ SOLN
25.0000 ug | INTRAMUSCULAR | Status: DC | PRN
  Administered 2017-07-30 (×2): 50 ug via INTRAVENOUS

## 2017-07-30 MED ORDER — MIDAZOLAM HCL 2 MG/2ML IJ SOLN
INTRAMUSCULAR | Status: AC
  Filled 2017-07-30: qty 2

## 2017-07-30 MED ORDER — ONDANSETRON HCL 4 MG/2ML IJ SOLN: 4.0000 mg | Freq: Once | INTRAMUSCULAR | Status: DC | PRN

## 2017-07-30 MED ORDER — MIDAZOLAM HCL 5 MG/5ML IJ SOLN
INTRAMUSCULAR | Status: DC | PRN
  Administered 2017-07-30: 2 mg via INTRAVENOUS

## 2017-07-30 MED ORDER — MEPERIDINE HCL 50 MG/ML IJ SOLN: 6.2500 mg | INTRAMUSCULAR | Status: DC | PRN

## 2017-07-30 MED ORDER — CEFAZOLIN SODIUM-DEXTROSE 2-4 GM/100ML-% IV SOLN
2.0000 g | INTRAVENOUS | Status: AC
  Administered 2017-07-30: 2 g via INTRAVENOUS
  Filled 2017-07-30: qty 100

## 2017-07-30 MED ORDER — PROPOFOL 10 MG/ML IV BOLUS
INTRAVENOUS | Status: AC
  Filled 2017-07-30: qty 20

## 2017-07-30 MED ORDER — IOPAMIDOL (ISOVUE-300) INJECTION 61%
INTRAVENOUS | Status: DC | PRN
  Administered 2017-07-30: 2.5 mL

## 2017-07-30 MED ORDER — ROCURONIUM BROMIDE 50 MG/5ML IV SOSY
PREFILLED_SYRINGE | INTRAVENOUS | Status: AC
  Filled 2017-07-30: qty 5

## 2017-07-30 MED ORDER — BUPIVACAINE LIPOSOME 1.3 % IJ SUSP
INTRAMUSCULAR | Status: DC | PRN
  Administered 2017-07-30: 20 mL

## 2017-07-30 MED ORDER — BUPIVACAINE LIPOSOME 1.3 % IJ SUSP
20.0000 mL | Freq: Once | INTRAMUSCULAR | Status: DC
  Filled 2017-07-30: qty 20

## 2017-07-30 MED ORDER — CELECOXIB 200 MG PO CAPS
200.0000 mg | ORAL_CAPSULE | ORAL | Status: AC
  Administered 2017-07-30: 200 mg via ORAL
  Filled 2017-07-30: qty 1

## 2017-07-30 MED ORDER — BUPIVACAINE-EPINEPHRINE (PF) 0.25% -1:200000 IJ SOLN
INTRAMUSCULAR | Status: AC
  Filled 2017-07-30: qty 30

## 2017-07-30 MED ORDER — IOPAMIDOL (ISOVUE-300) INJECTION 61%
INTRAVENOUS | Status: AC
  Filled 2017-07-30: qty 50

## 2017-07-30 SURGICAL SUPPLY — 42 items
ADH SKN CLS APL DERMABOND .7 (GAUZE/BANDAGES/DRESSINGS) ×1
APL SKNCLS STERI-STRIP NONHPOA (GAUZE/BANDAGES/DRESSINGS)
APPLICATOR COTTON TIP 6IN STRL (MISCELLANEOUS) ×4 IMPLANT
APPLIER CLIP ROT 10 11.4 M/L (STAPLE) ×3
APR CLP MED LRG 11.4X10 (STAPLE) ×1
BAG SPEC RTRVL 10 TROC 200 (ENDOMECHANICALS) ×1
BENZOIN TINCTURE PRP APPL 2/3 (GAUZE/BANDAGES/DRESSINGS) IMPLANT
CABLE HIGH FREQUENCY MONO STRZ (ELECTRODE) ×3 IMPLANT
CATH REDDICK CHOLANGI 4FR 50CM (CATHETERS) ×3 IMPLANT
CLIP APPLIE ROT 10 11.4 M/L (STAPLE) ×1 IMPLANT
CLOSURE WOUND 1/2 X4 (GAUZE/BANDAGES/DRESSINGS)
COVER MAYO STAND STRL (DRAPES) ×3 IMPLANT
COVER SURGICAL LIGHT HANDLE (MISCELLANEOUS) ×3 IMPLANT
DECANTER SPIKE VIAL GLASS SM (MISCELLANEOUS) ×3 IMPLANT
DERMABOND ADVANCED (GAUZE/BANDAGES/DRESSINGS) ×2
DERMABOND ADVANCED .7 DNX12 (GAUZE/BANDAGES/DRESSINGS) ×1 IMPLANT
DRAPE C-ARM 42X120 X-RAY (DRAPES) ×3 IMPLANT
ELECT PENCIL ROCKER SW 15FT (MISCELLANEOUS) IMPLANT
ELECT REM PT RETURN 15FT ADLT (MISCELLANEOUS) ×3 IMPLANT
GLOVE BIOGEL M 8.0 STRL (GLOVE) ×3 IMPLANT
GOWN STRL REUS W/TWL XL LVL3 (GOWN DISPOSABLE) ×9 IMPLANT
HEMOSTAT SURGICEL 4X8 (HEMOSTASIS) IMPLANT
IV CATH 14GX2 1/4 (CATHETERS) ×3 IMPLANT
KIT BASIN OR (CUSTOM PROCEDURE TRAY) ×3 IMPLANT
L-HOOK LAP DISP 36CM (ELECTROSURGICAL)
LHOOK LAP DISP 36CM (ELECTROSURGICAL) IMPLANT
POUCH RETRIEVAL ECOSAC 10 (ENDOMECHANICALS) IMPLANT
POUCH RETRIEVAL ECOSAC 10MM (ENDOMECHANICALS) ×2
SCISSORS LAP 5X45 EPIX DISP (ENDOMECHANICALS) ×3 IMPLANT
SET IRRIG TUBING LAPAROSCOPIC (IRRIGATION / IRRIGATOR) ×1 IMPLANT
SET TRI-LUMEN FLTR TB AIRSEAL (TUBING) ×2 IMPLANT
SLEEVE XCEL OPT CAN 5 100 (ENDOMECHANICALS) ×5 IMPLANT
STRIP CLOSURE SKIN 1/2X4 (GAUZE/BANDAGES/DRESSINGS) IMPLANT
SUT MNCRL AB 4-0 PS2 18 (SUTURE) ×3 IMPLANT
SYR 20CC LL (SYRINGE) ×3 IMPLANT
TOWEL OR 17X26 10 PK STRL BLUE (TOWEL DISPOSABLE) ×3 IMPLANT
TRAY LAPAROSCOPIC (CUSTOM PROCEDURE TRAY) ×3 IMPLANT
TROCAR BLADELESS OPT 5 100 (ENDOMECHANICALS) ×3 IMPLANT
TROCAR PORT AIRSEAL 5X120 (TROCAR) ×2 IMPLANT
TROCAR XCEL BLUNT TIP 100MML (ENDOMECHANICALS) IMPLANT
TROCAR XCEL NON-BLD 11X100MML (ENDOMECHANICALS) ×3 IMPLANT
TUBING INSUF HEATED (TUBING) ×3 IMPLANT

## 2017-07-30 NOTE — Anesthesia Preprocedure Evaluation (Signed)
Anesthesia Evaluation  Patient identified by MRN, date of birth, ID band Patient awake    Reviewed: Allergy & Precautions, NPO status , Patient's Chart, lab work & pertinent test results  History of Anesthesia Complications (+) PONV and history of anesthetic complications  Airway Mallampati: II  TM Distance: >3 FB Neck ROM: Full    Dental no notable dental hx.    Pulmonary neg pulmonary ROS,    Pulmonary exam normal breath sounds clear to auscultation       Cardiovascular negative cardio ROS Normal cardiovascular exam Rhythm:Regular Rate:Normal     Neuro/Psych negative neurological ROS  negative psych ROS   GI/Hepatic negative GI ROS, Neg liver ROS, GERD  Medicated,  Endo/Other  negative endocrine ROS  Renal/GU negative Renal ROS  negative genitourinary   Musculoskeletal negative musculoskeletal ROS (+)   Abdominal   Peds negative pediatric ROS (+)  Hematology negative hematology ROS (+)   Anesthesia Other Findings   Reproductive/Obstetrics negative OB ROS                             Anesthesia Physical Anesthesia Plan  ASA: II  Anesthesia Plan: General   Post-op Pain Management:    Induction: Intravenous  PONV Risk Score and Plan: 3 and Ondansetron, Dexamethasone, Midazolam, Treatment may vary due to age or medical condition and Scopolamine patch - Pre-op  Airway Management Planned: Oral ETT  Additional Equipment:   Intra-op Plan:   Post-operative Plan: Extubation in OR  Informed Consent:   Plan Discussed with:   Anesthesia Plan Comments: (  )        Anesthesia Quick Evaluation

## 2017-07-30 NOTE — Interval H&P Note (Signed)
History and Physical Interval Note:  07/30/2017 1:05 PM  Desiree Price  has presented today for surgery, with the diagnosis of CHRONIC CHOLECYSTITIS  The various methods of treatment have been discussed with the patient and family. After consideration of risks, benefits and other options for treatment, the patient has consented to  Procedure(s): LAPAROSCOPIC CHOLECYSTECTOMY WITH INTRAOPERATIVE CHOLANGIOGRAM (N/A) as a surgical intervention .  The patient's history has been reviewed, patient examined, no change in status, stable for surgery.  I have reviewed the patient's chart and labs.  Questions were answered to the patient's satisfaction.     Nautia Lem B

## 2017-07-30 NOTE — Discharge Instructions (Signed)
General Anesthesia, Adult, Care After These instructions provide you with information about caring for yourself after your procedure. Your health care provider may also give you more specific instructions. Your treatment has been planned according to current medical practices, but problems sometimes occur. Call your health care provider if you have any problems or questions after your procedure. What can I expect after the procedure? After the procedure, it is common to have: Vomiting. A sore throat. Mental slowness.  It is common to feel: Nauseous. Cold or shivery. Sleepy. Tired. Sore or achy, even in parts of your body where you did not have surgery.  Follow these instructions at home: For at least 24 hours after the procedure: Do not: Participate in activities where you could fall or become injured. Drive. Use heavy machinery. Drink alcohol. Take sleeping pills or medicines that cause drowsiness. Make important decisions or sign legal documents. Take care of children on your own. Rest. Eating and drinking If you vomit, drink water, juice, or soup when you can drink without vomiting. Drink enough fluid to keep your urine clear or pale yellow. Make sure you have little or no nausea before eating solid foods. Follow the diet recommended by your health care provider. General instructions Have a responsible adult stay with you until you are awake and alert. Return to your normal activities as told by your health care provider. Ask your health care provider what activities are safe for you. Take over-the-counter and prescription medicines only as told by your health care provider. If you smoke, do not smoke without supervision. Keep all follow-up visits as told by your health care provider. This is important. Contact a health care provider if: You continue to have nausea or vomiting at home, and medicines are not helpful. You cannot drink fluids or start eating again. You cannot  urinate after 8-12 hours. You develop a skin rash. You have fever. You have increasing redness at the site of your procedure. Get help right away if: You have difficulty breathing. You have chest pain. You have unexpected bleeding. You feel that you are having a life-threatening or urgent problem. This information is not intended to replace advice given to you by your health care provider. Make sure you discuss any questions you have with your health care provider. Document Released: 01/07/2001 Document Revised: 03/05/2016 Document Reviewed: 09/15/2015 Elsevier Interactive Patient Education  2018 [employer]. Laparoscopic Cholecystectomy Laparoscopic cholecystectomy is surgery to remove the gallbladder. The gallbladder is a pear-shaped organ that lies beneath the liver on the right side of the body. The gallbladder stores bile, which is a fluid that helps the body to digest fats. Cholecystectomy is often done for inflammation of the gallbladder (cholecystitis). This condition is usually caused by a buildup of gallstones (cholelithiasis) in the gallbladder. Gallstones can block the flow of bile, which can result in inflammation and pain. In severe cases, emergency surgery may be required. This procedure is done though small incisions in your abdomen (laparoscopic surgery). A thin scope with a camera (laparoscope) is inserted through one incision. Thin surgical instruments are inserted through the other incisions. In some cases, a laparoscopic procedure may be turned into a type of surgery that is done through a larger incision (open surgery). Tell a health care provider about:  Any allergies you have.  All medicines you are taking, including vitamins, herbs, eye drops, creams, and over-the-counter medicines.  Any problems you or family members have had with anesthetic medicines.  Any blood disorders you have.  Any surgeries you have had.  Any medical conditions you have.  Whether you  are pregnant or may be pregnant. What are the risks? Generally, this is a safe procedure. However, problems may occur, including:  Infection.  Bleeding.  Allergic reactions to medicines.  Damage to other structures or organs.  A stone remaining in the common bile duct. The common bile duct carries bile from the gallbladder into the small intestine.  A bile leak from the cyst duct that is clipped when your gallbladder is removed.  What happens before the procedure? Staying hydrated Follow instructions from your health care provider about hydration, which may include:  Up to 2 hours before the procedure - you may continue to drink clear liquids, such as water, clear fruit juice, black coffee, and plain tea.  Eating and drinking restrictions Follow instructions from your health care provider about eating and drinking, which may include:  8 hours before the procedure - stop eating heavy meals or foods such as meat, fried foods, or fatty foods.  6 hours before the procedure - stop eating light meals or foods, such as toast or cereal.  6 hours before the procedure - stop drinking milk or drinks that contain milk.  2 hours before the procedure - stop drinking clear liquids.  Medicines  Ask your health care provider about: ? Changing or stopping your regular medicines. This is especially important if you are taking diabetes medicines or blood thinners. ? Taking medicines such as aspirin and ibuprofen. These medicines can thin your blood. Do not take these medicines before your procedure if your health care provider instructs you not to.  You may be given antibiotic medicine to help prevent infection. General instructions  Let your health care provider know if you develop a cold or an infection before surgery.  Plan to have someone take you home from the hospital or clinic.  Ask your health care provider how your surgical site will be marked or identified. What happens during  the procedure?  To reduce your risk of infection: ? Your health care team will wash or sanitize their hands. ? Your skin will be washed with soap. ? Hair may be removed from the surgical area.  An IV tube may be inserted into one of your veins.  You will be given one or more of the following: ? A medicine to help you relax (sedative). ? A medicine to make you fall asleep (general anesthetic).  A breathing tube will be placed in your mouth.  Your surgeon will make several small cuts (incisions) in your abdomen.  The laparoscope will be inserted through one of the small incisions. The camera on the laparoscope will send images to a TV screen (monitor) in the operating room. This lets your surgeon see inside your abdomen.  Air-like gas will be pumped into your abdomen. This will expand your abdomen to give the surgeon more room to perform the surgery.  Other tools that are needed for the procedure will be inserted through the other incisions. The gallbladder will be removed through one of the incisions.  Your common bile duct may be examined. If stones are found in the common bile duct, they may be removed.  After your gallbladder has been removed, the incisions will be closed with stitches (sutures), staples, or skin glue.  Your incisions may be covered with a bandage (dressing). The procedure may vary among health care providers and hospitals. What happens after the procedure?  Your blood pressure, heart  rate, breathing rate, and blood oxygen level will be monitored until the medicines you were given have worn off.  You will be given medicines as needed to control your pain.  Do not drive for 24 hours if you were given a sedative. This information is not intended to replace advice given to you by your health care provider. Make sure you discuss any questions you have with your health care provider. Document Released: 10/01/2005 Document Revised: 04/22/2016 Document Reviewed:  03/19/2016 Elsevier Interactive Patient Education  2018 [employer].

## 2017-07-30 NOTE — Op Note (Signed)
Dola Lunsford   07/30/2017  2:45 PM  Procedure: Laparoscopic Cholecystectomy with intraoperative cholangiogram  Surgeon: Catalina Antigua B. Hassell Done, MD, FACS Asst:  Adonis Housekeeper, MD,FACS  Anes:  General  Drains:  None  Findings: Chronic cholecystitis with normal IOC  Description of Procedure: The patient was taken to OR 1 and given general anesthesia.  The patient was prepped with Technicare and draped sterilely. A time out was performed.  Access to the abdomen was achieved with a 5 mm Optiview through the right upper quadrant.  The airseal was used.  Port placement included an 11 in the upper midline; the two fives and the 8 mm for the air seal.    The gallbladder was visualized and the fundus was grasped and the gallbladder was elevated. Traction on the infundibulum allowed for successful demonstration of the critical view. Inflammatory changes were chronic with the gallbladder being white and opaque.  The cystic duct was identified and clipped up on the gallbladder and an incision was made in the cystic duct and the Reddick catheter was inserted after milking the cystic duct of any debris. A dynamic cholangiogram was performed which demonstrated a long cystic duct that crossed over the CBD; no stones seen.    The cystic duct was then triple clipped and divided, the cystic artery was double clipped and divided and then the gallbladder was removed from the gallbladder bed. Removal of the gallbladder from the gallbladder bed was performed without entering it.  .  The gallbladder was then placed in a bag and brought out through one of the trocar sites. The gallbladder bed was inspected and no bleeding or bile leaks were seen.  I inspected the pelvis and a more prominent gubernaculum was seen on the left without obvious hernia formation.    Incisions were injected with Exparel and closed with 4-0 Monocryl and Dermabond on the skin.  Sponge and needle count were correct.    The patient was taken to the  recovery room in satisfactory condition.

## 2017-07-30 NOTE — Transfer of Care (Signed)
Immediate Anesthesia Transfer of Care Note  Patient: Camry Robello  Procedure(s) Performed: LAPAROSCOPIC CHOLECYSTECTOMY WITH INTRAOPERATIVE CHOLANGIOGRAM (N/A Abdomen)  Patient Location: PACU  Anesthesia Type:General  Level of Consciousness: awake, alert  and oriented  Airway & Oxygen Therapy: Patient Spontanous Breathing and Patient connected to face mask oxygen  Post-op Assessment: Report given to RN and Post -op Vital signs reviewed and stable  Post vital signs: Reviewed and stable  Last Vitals:  Vitals:   07/30/17 1155  BP: 117/84  Pulse: 94  Resp: 18  Temp: 36.7 C  SpO2: 99%    Last Pain:  Vitals:   07/30/17 1155  TempSrc: Oral      Patients Stated Pain Goal: 4 (94/80/16 5537)  Complications: No apparent anesthesia complications

## 2017-07-30 NOTE — Anesthesia Postprocedure Evaluation (Signed)
Anesthesia Post Note  Patient: Rodina Pinales  Procedure(s) Performed: LAPAROSCOPIC CHOLECYSTECTOMY WITH INTRAOPERATIVE CHOLANGIOGRAM (N/A Abdomen)     Patient location during evaluation: PACU Anesthesia Type: General Level of consciousness: awake and alert Pain management: pain level controlled Vital Signs Assessment: post-procedure vital signs reviewed and stable Respiratory status: spontaneous breathing, nonlabored ventilation, respiratory function stable and patient connected to nasal cannula oxygen Cardiovascular status: blood pressure returned to baseline and stable Postop Assessment: no apparent nausea or vomiting Anesthetic complications: no    Last Vitals:  Vitals:   07/30/17 1155 07/30/17 1500  BP: 117/84 114/72  Pulse: 94 96  Resp: 18 19  Temp: 36.7 C 36.7 C  SpO2: 99% 96%    Last Pain:  Vitals:   07/30/17 1530  TempSrc:   PainSc: Asleep                 Jabri Blancett

## 2017-07-30 NOTE — Anesthesia Procedure Notes (Signed)
Procedure Name: Intubation Date/Time: 07/30/2017 1:23 PM Performed by: Noralyn Pick D Pre-anesthesia Checklist: Patient identified, Emergency Drugs available, Suction available and Patient being monitored Patient Re-evaluated:Patient Re-evaluated prior to induction Oxygen Delivery Method: Circle system utilized Preoxygenation: Pre-oxygenation with 100% oxygen Induction Type: IV induction Ventilation: Mask ventilation without difficulty Laryngoscope Size: Mac and 3 Grade View: Grade I Tube type: Oral Tube size: 7.5 mm Number of attempts: 1 Airway Equipment and Method: Stylet Placement Confirmation: ETT inserted through vocal cords under direct vision,  positive ETCO2 and breath sounds checked- equal and bilateral Secured at: 21 cm Tube secured with: Tape Dental Injury: Teeth and Oropharynx as per pre-operative assessment

## 2017-07-31 ENCOUNTER — Encounter (HOSPITAL_COMMUNITY): Payer: Self-pay | Admitting: Surgery

## 2017-12-03 IMAGING — DX DG ABDOMEN ACUTE W/ 1V CHEST
4 series · 4 of 4 positions shown · non-contrast
Comparison: None.

CLINICAL DATA: Globus sensation.  No abdominal pain reported.

EXAM:
DG ABDOMEN ACUTE W/ 1V CHEST

[chest pa]
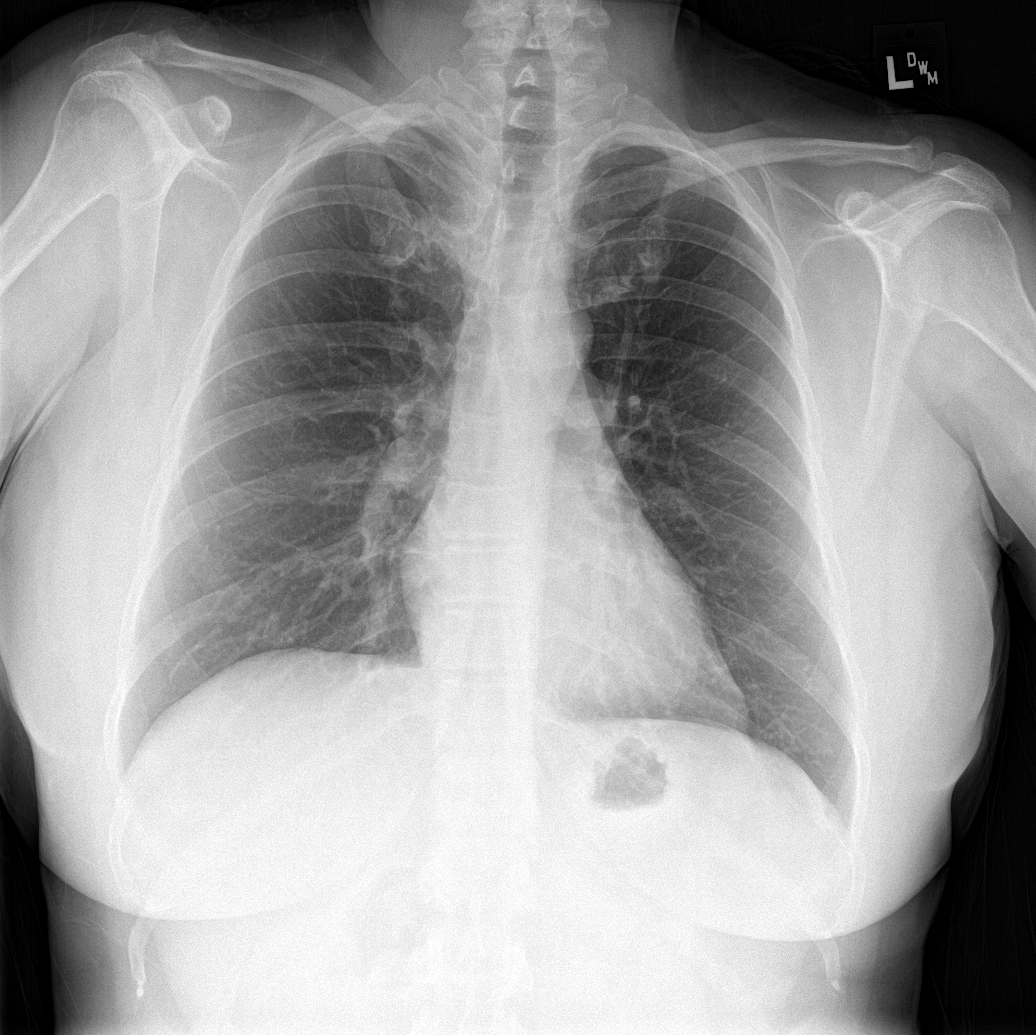

[abdomen erect]
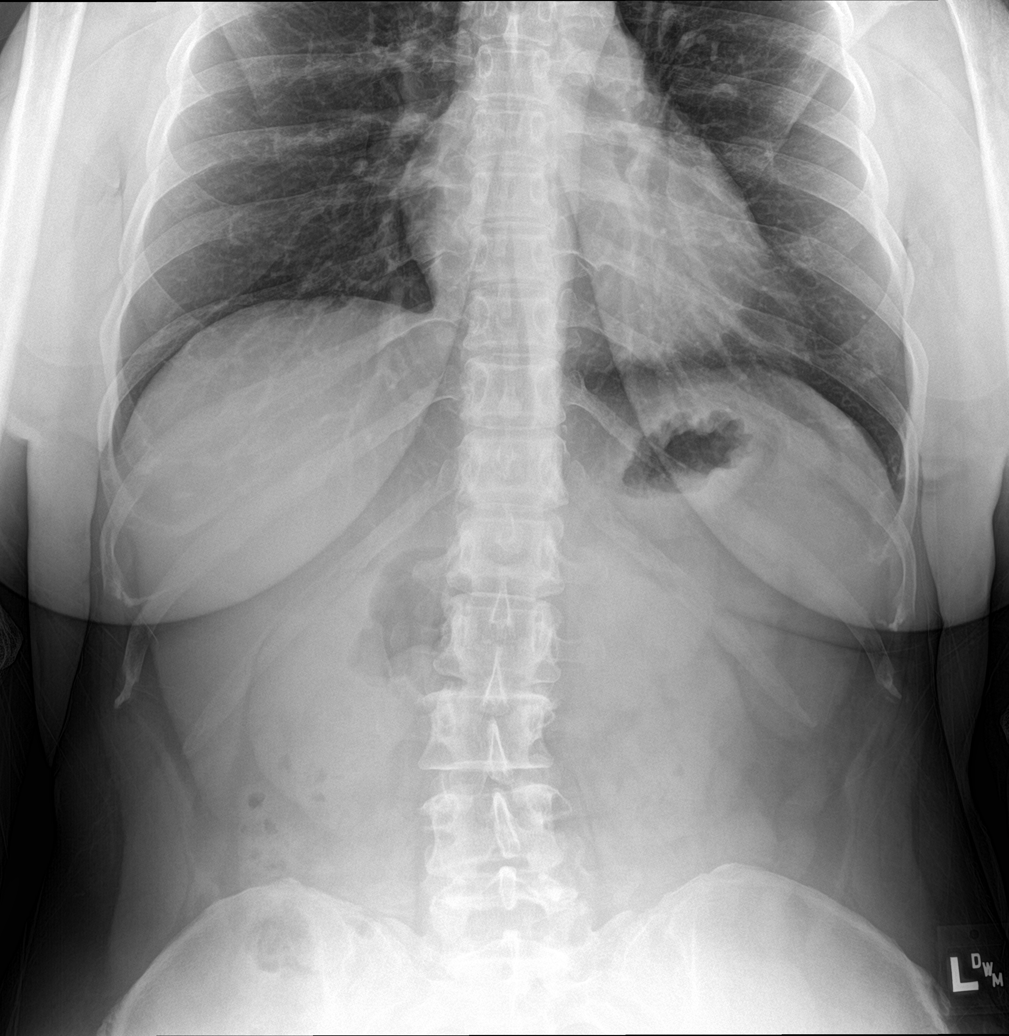

[abdomen supine (1 of 2)]
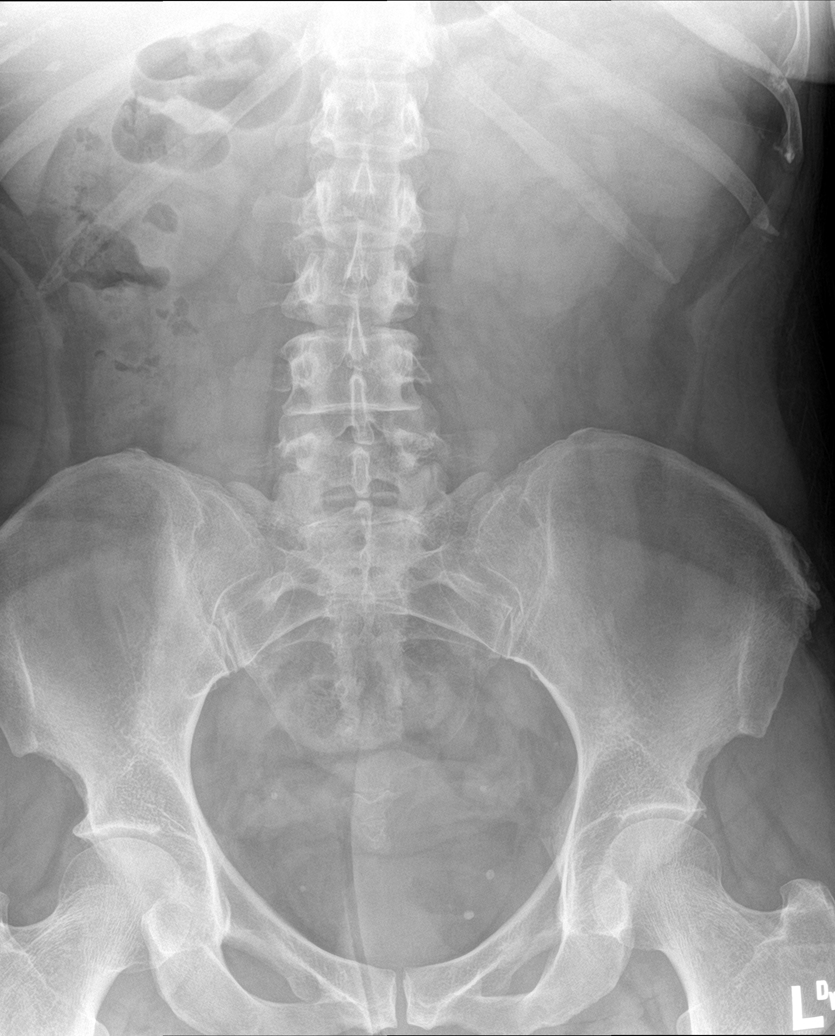

[abdomen supine (2 of 2)]
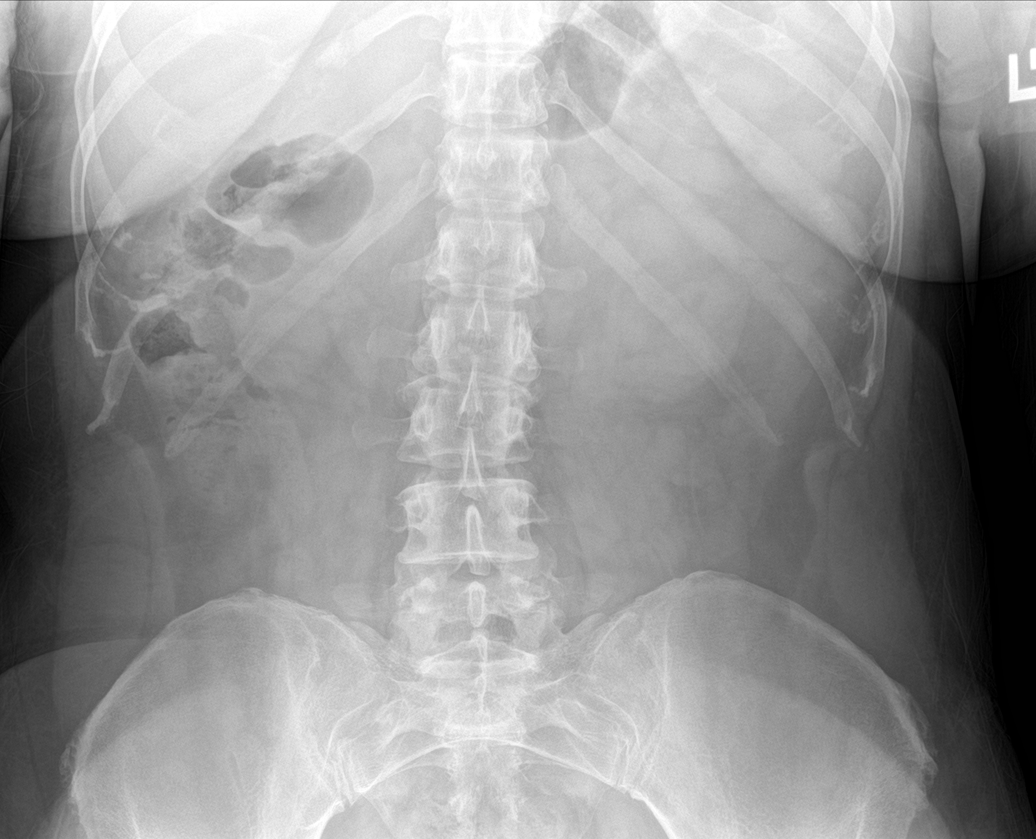

[4 of 4 positions shown; findings below may reference images not displayed]

FINDINGS: Normal heart size. Normal mediastinal contour. No pneumothorax. No
pleural effusion. Lungs appear clear, with no acute consolidative
airspace disease and no pulmonary edema. Relatively gasless abdomen,
with no dilated small bowel loops or air-fluid levels. Minimal
colonic stool. No evidence of pneumatosis or pneumoperitoneum. No
radiopaque urolithiasis.
IMPRESSION: 1. No active disease in the chest.
2. Nonobstructive bowel gas pattern.

## 2020-02-29 ENCOUNTER — Ambulatory Visit: Payer: 59 | Attending: Internal Medicine

## 2020-07-06 ENCOUNTER — Ambulatory Visit (INDEPENDENT_AMBULATORY_CARE_PROVIDER_SITE_OTHER): Payer: 59 | Admitting: Physician Assistant

## 2020-07-06 ENCOUNTER — Encounter: Payer: Self-pay | Admitting: Physician Assistant

## 2020-07-06 ENCOUNTER — Other Ambulatory Visit: Payer: Self-pay

## 2020-07-06 DIAGNOSIS — D485 Neoplasm of uncertain behavior of skin: Secondary | ICD-10-CM | POA: Diagnosis not present

## 2020-07-06 DIAGNOSIS — Z1283 Encounter for screening for malignant neoplasm of skin: Secondary | ICD-10-CM | POA: Diagnosis not present

## 2020-07-06 DIAGNOSIS — L570 Actinic keratosis: Secondary | ICD-10-CM

## 2020-07-06 MED ORDER — KLISYRI 1 % EX OINT: 1.0000 "application " | TOPICAL_OINTMENT | Freq: Every day | CUTANEOUS | 0 refills | Status: AC

## 2020-07-06 NOTE — Progress Notes (Signed)
   Follow up Visit  Subjective  Desiree Price is a 49 y.o. female who presents for the following: Annual Exam (Concerns right lower thinks its a wart.). Pink bump right lower leg. Present for 6 months. Not sore. Mole on back that has been raised and gets irritated.  Objective  Well appearing patient in no apparent distress; mood and affect are within normal limits.  A full examination was performed including head, eyes, ears, nose, lips, neck, chest, axillae, abdomen, back, buttocks, bilateral upper extremities, bilateral lower extremities, hands, feet, fingers, toes, fingernails, and toenails. All findings within normal limits unless otherwise noted below.   Objective  Mid Back: Full body skin check.   Objective  Mid Back: Inflamed papule     Objective  Right Lower Leg superior: Scabbed papule     Objective  Right Lower Leg inferior: Scabbed papule     Objective  Left Forehead: Erythematous patches with gritty scale.  Assessment & Plan  Screening for malignant neoplasm of skin Mid Back  Neoplasm of uncertain behavior of skin (3) Mid Back  Skin / nail biopsy Type of biopsy: tangential   Informed consent: discussed and consent obtained   Timeout: patient name, date of birth, surgical site, and procedure verified   Procedure prep:  Patient was prepped and draped in usual sterile fashion (Non sterile) Prep type:  Chlorhexidine Anesthesia: the lesion was anesthetized in a standard fashion   Anesthetic:  1% lidocaine w/ epinephrine 1-100,000 local infiltration Instrument used: flexible razor blade   Outcome: patient tolerated procedure well   Post-procedure details: wound care instructions given    Specimen 1 - Surgical pathology Differential Diagnosis: scc vs bcc Check Margins: No  Right Lower Leg superior  Skin / nail biopsy Type of biopsy: tangential   Informed consent: discussed and consent obtained   Timeout: patient name, date of birth, surgical  site, and procedure verified   Procedure prep:  Patient was prepped and draped in usual sterile fashion (Non sterile) Prep type:  Chlorhexidine Anesthesia: the lesion was anesthetized in a standard fashion   Anesthetic:  1% lidocaine w/ epinephrine 1-100,000 local infiltration Instrument used: flexible razor blade   Outcome: patient tolerated procedure well   Post-procedure details: wound care instructions given    Specimen 2 - Surgical pathology Differential Diagnosis: scc vs bcc Check Margins: No  Right Lower Leg inferior  Skin / nail biopsy Type of biopsy: tangential   Informed consent: discussed and consent obtained   Timeout: patient name, date of birth, surgical site, and procedure verified   Procedure prep:  Patient was prepped and draped in usual sterile fashion (Non sterile) Prep type:  Chlorhexidine Anesthesia: the lesion was anesthetized in a standard fashion   Anesthetic:  1% lidocaine w/ epinephrine 1-100,000 local infiltration Instrument used: flexible razor blade   Outcome: patient tolerated procedure well   Post-procedure details: wound care instructions given    Specimen 3 - Surgical pathology Differential Diagnosis: scc vs bcc Check Margins: No  Actinic keratosis  Ordered Medications: Tirbanibulin (KLISYRI) 1 % OINT  AK (actinic keratosis) Left Forehead  She will use this sample to apply to her left forehead a small film nightly for 5 nights. Warned that the area will look worse before better.  Tirbanibulin (KLISYRI) 1 % OINT - Left Forehead

## 2020-07-06 NOTE — Patient Instructions (Signed)

## 2023-02-05 ENCOUNTER — Emergency Department (HOSPITAL_BASED_OUTPATIENT_CLINIC_OR_DEPARTMENT_OTHER)
Admission: EM | Admit: 2023-02-05 | Discharge: 2023-02-05 | Disposition: A | Discharge status: Home / Self Care | Payer: 59 | Attending: Emergency Medicine | Admitting: Emergency Medicine

## 2023-02-05 ENCOUNTER — Encounter (HOSPITAL_BASED_OUTPATIENT_CLINIC_OR_DEPARTMENT_OTHER): Payer: Self-pay | Admitting: Urology

## 2023-02-05 ENCOUNTER — Emergency Department (HOSPITAL_BASED_OUTPATIENT_CLINIC_OR_DEPARTMENT_OTHER): Payer: 59

## 2023-02-05 ENCOUNTER — Other Ambulatory Visit: Payer: Self-pay

## 2023-02-05 DIAGNOSIS — I10 Essential (primary) hypertension: Secondary | ICD-10-CM | POA: Diagnosis not present

## 2023-02-05 DIAGNOSIS — R000 Tachycardia, unspecified: Secondary | ICD-10-CM | POA: Insufficient documentation

## 2023-02-05 DIAGNOSIS — R079 Chest pain, unspecified: Secondary | ICD-10-CM | POA: Insufficient documentation

## 2023-02-05 DIAGNOSIS — R7989 Other specified abnormal findings of blood chemistry: Secondary | ICD-10-CM | POA: Diagnosis not present

## 2023-02-05 LAB — CBC WITH DIFFERENTIAL/PLATELET
Abs Immature Granulocytes: 0.01 10*3/uL (ref 0.00–0.07)
Basophils Absolute: 0 10*3/uL (ref 0.0–0.1)
Basophils Relative: 0 %
Eosinophils Absolute: 0.2 10*3/uL (ref 0.0–0.5)
Eosinophils Relative: 3 %
HCT: 40.5 % (ref 36.0–46.0)
Hemoglobin: 14.1 g/dL (ref 12.0–15.0)
Immature Granulocytes: 0 %
Lymphocytes Relative: 38 %
Lymphs Abs: 2.8 10*3/uL (ref 0.7–4.0)
MCH: 30.6 pg (ref 26.0–34.0)
MCHC: 34.8 g/dL (ref 30.0–36.0)
MCV: 87.9 fL (ref 80.0–100.0)
Monocytes Absolute: 0.4 10*3/uL (ref 0.1–1.0)
Monocytes Relative: 6 %
Neutro Abs: 3.8 10*3/uL (ref 1.7–7.7)
Neutrophils Relative %: 53 %
Platelets: 386 10*3/uL (ref 150–400)
RBC: 4.61 MIL/uL (ref 3.87–5.11)
RDW: 12.5 % (ref 11.5–15.5)
WBC: 7.3 10*3/uL (ref 4.0–10.5)
nRBC: 0 % (ref 0.0–0.2)

## 2023-02-05 LAB — BASIC METABOLIC PANEL
Anion gap: 9 (ref 5–15)
BUN: 19 mg/dL (ref 6–20)
CO2: 23 mmol/L (ref 22–32)
Calcium: 9.1 mg/dL (ref 8.9–10.3)
Chloride: 103 mmol/L (ref 98–111)
Creatinine, Ser: 1.45 mg/dL — ABNORMAL HIGH (ref 0.44–1.00)
GFR, Estimated: 43 mL/min — ABNORMAL LOW (ref >60)
Glucose, Bld: 131 mg/dL — ABNORMAL HIGH (ref 70–99)
Potassium: 3.7 mmol/L (ref 3.5–5.1)
Sodium: 135 mmol/L (ref 135–145)

## 2023-02-05 LAB — TROPONIN I (HIGH SENSITIVITY)
Troponin I (High Sensitivity): <2 ng/L (ref <18)
Troponin I (High Sensitivity): 2 ng/L (ref <18)

## 2023-02-05 MED ORDER — SODIUM CHLORIDE 0.9 % IV BOLUS
1000.0000 mL | Freq: Once | INTRAVENOUS | Status: AC
  Administered 2023-02-05: 1000 mL via INTRAVENOUS

## 2023-02-05 NOTE — Discharge Instructions (Addendum)
Thank you for allowing me to be a part of your care today.   Your work-up today is overall reassuring, however, I would suggest increasing your water intake and decreasing the amount of soda you drink during the day as this has a negative effect on your kidneys.   Please call the [facility] phone number to schedule primary care follow-up appointment.  An ambulatory referral for cardiology has been placed.  Their office should call you within 72 hours.  If you do not hear from them, call the number provided.   Return to the ED if you experience worsening of your symptoms or if you have any new concerns.

## 2023-02-05 NOTE — ED Provider Notes (Signed)
[facility] Provider Note   CSN: [id number] Arrival date & time: 02/05/23  1419     History  Chief Complaint  Patient presents with   Chest Pain    Desiree Price is a 52 y.o. female with past medical history significant for GERD, hypertension presents to the ED complaining of chest pain that radiated down her left arm and into her neck that began on Saturday night.  She reports this chest pain has resolved.  She does still have soreness in her neck.  She reports she recently restarted Mounjaro at the highest dose a few days ago.  The wellness center she [facility] to started her on phentermine for a few weeks until [facility] was back in stock.  She is not sure if her symptoms are related to this.  She states she has poor water intake and drinks mainly [facility].  Denies fever, shortness of breath, nausea, vomiting, abdominal pain, syncope, dizziness, lightheadedness, headache.        Home Medications Prior to Admission medications   Medication Sig Start Date End Date Taking? Authorizing Provider  clobetasol cream (TEMOVATE) 0.05 % clobetasol 0.05 % topical cream    [provider]  diphenhydrAMINE (BENADRYL) 25 mg capsule Take 50 mg by mouth every 8 (eight) hours as needed for itching or allergies.    [provider]  fluocinolone (VANOS) 0.01 % cream Apply 1 application topically 2 (two) times daily. 05/06/20   [provider]  gabapentin (NEURONTIN) 600 MG tablet Take 600 mg by mouth at bedtime. 05/05/20   [provider]  HYDROcodone-acetaminophen (NORCO) 5-325 MG tablet Take 1 tablet by mouth every 4 (four) hours as needed for moderate pain. 07/30/17   Luretha Murphy, MD  ondansetron (ZOFRAN-ODT) 4 MG disintegrating tablet PLACE 1 TABLET UNDER THE TONGUE EVERY 4 TO 6 HOURS AS NEEDED FOR NAUSEA AND VOMITING 07/01/17   [provider]  pantoprazole (PROTONIX) 40 MG tablet Take 40 mg by mouth daily.  07/11/17   [provider]  Tirbanibulin (KLISYRI) 1 % OINT Apply 1 application topically at bedtime. 07/06/20   Purvis Sidle-Burning, Victorino Dike, PA-C      Allergies    No known allergies    Review of Systems   Review of Systems  Constitutional:  Negative for fever.  Respiratory:  Negative for shortness of breath.   Cardiovascular:  Positive for chest pain.  Gastrointestinal:  Negative for abdominal pain, nausea and vomiting.  Musculoskeletal:  Positive for neck pain.  Neurological:  Negative for dizziness, syncope, light-headedness and headaches.    Physical Exam Updated Vital Signs BP 135/85 (BP Location: Left Arm)   Pulse 92   Temp 98.5 F (36.9 C) (Oral)   Resp 17   Ht  (1.676 m)   Wt 87.1 kg   LMP 03/29/2017   SpO2 98%   BMI 30.99 kg/m  Physical Exam Vitals and nursing note reviewed.  Constitutional:      General: She is not in acute distress.    Appearance: She is not ill-appearing.  HENT:     Mouth/Throat:     Mouth: Mucous membranes are moist.     Pharynx: Oropharynx is clear.  Cardiovascular:     Rate and Rhythm: Regular rhythm. Tachycardia present.     Pulses: Normal pulses.     Heart sounds: Normal heart sounds.  Pulmonary:     Effort: Pulmonary effort is normal. No respiratory distress.     Breath  sounds: Normal breath sounds and air entry.  Abdominal:     General: Abdomen is flat. Bowel sounds are normal. There is no distension.     Palpations: Abdomen is soft.     Tenderness: There is no abdominal tenderness.  Musculoskeletal:     Right lower leg: No edema.     Left lower leg: No edema.  Skin:    General: Skin is warm and dry.     Capillary Refill: Capillary refill takes less than 2 seconds.  Neurological:     Mental Status: She is alert. Mental status is at baseline.  Psychiatric:        Mood and Affect: Mood normal.        Behavior: Behavior normal.     ED Results / Procedures / Treatments   Labs (all labs ordered are listed, but  only abnormal results are displayed) Labs Reviewed  BASIC METABOLIC PANEL - Abnormal; Notable for the following components:      Result Value   Glucose, Bld 131 (*)    Creatinine, Ser 1.45 (*)    GFR, Estimated 43 (*)    All other components within normal limits  CBC WITH DIFFERENTIAL/PLATELET  TROPONIN I (HIGH SENSITIVITY)  TROPONIN I (HIGH SENSITIVITY)    EKG EKG Interpretation  Date/Time:  Tuesday February 05 2023 14:29:22 EDT Ventricular Rate:  99 PR Interval:  127 QRS Duration: 88 QT Interval:  449 QTC Calculation: 577 R Axis:   86 Text Interpretation: Sinus rhythm Low voltage, precordial leads Confirmed by Virgina Norfolk ([id number]) on 02/05/2023 2:31:08 PM  Radiology DG Chest Portable 1 View  Result Date: 02/05/2023 CLINICAL DATA:  Chest pain. EXAM: PORTABLE CHEST 1 VIEW COMPARISON:  04/19/2017. FINDINGS: Clear lungs. Normal heart size and mediastinal contours. No pleural effusion or pneumothorax. Visualized bones and upper abdomen are unremarkable. IMPRESSION: No evidence of acute cardiopulmonary disease. Electronically Signed   By: Orvan Falconer M.D.   On: 02/05/2023 14:57    Procedures Procedures    Medications Ordered in ED Medications  sodium chloride 0.9 % bolus 1,000 mL ( Intravenous Stopped 02/05/23 1641)    ED Course/ Medical Decision Making/ A&P                             Medical Decision Making Amount and/or Complexity of Data Reviewed Labs: ordered. Radiology: ordered.   This patient presents to the ED with chief complaint(s) of chest pain that radiated into neck and left arm with pertinent past medical history of GERD, hypertension.  The complaint involves an extensive differential diagnosis and also carries with it a high risk of complications and morbidity.    The differential diagnosis includes ACS, GERD, musculoskeletal strain or sprain, muscle spasm.  This list is not exhaustive.    The initial plan is to obtain chest pain workup  Additional  history obtained: Records reviewed  - patient's last lab values from 5 years prior.  She has not had any recent primary care.  She does get regular mammograms.  Initial Assessment:   On exam, patient is mildly tachycardic with heart rate of 101-106 bpm.  Cardiac monitor with sinus tachycardia.  She is resting comfortably in bed and is not in acute distress.  She is alert, oriented, and speaks in full sentences.  No appreciable murmurs on exam.  Lungs clear to auscultation bilaterally.  No pedal edema.    Independent ECG/labs interpretation:  The following labs were independently  interpreted:  CBC without leukocytosis or anemia.  Metabolic panel with reduced renal function, GFR of 43.  Patient without known history of CKD, but has not had primary care in the past few years.  Initial troponin negative.    Independent visualization and interpretation of imaging: I independently visualized the following imaging with scope of interpretation limited to determining acute life threatening conditions related to emergency care: chest x-ray, which revealed no evidence of pleural effusion, pulmonary edema, consolidation or infiltrate.  Cardiac silhouette is unremarkable.  I agree with radiologist interpretation.    Treatment and Reassessment: Based on conversation with patient, she does not maintain good hydration and admits to drinking little to no water during the day.  This may be accounting for her reduced kidney function.  Will give IV fluids.  Discussed importance of good hydration and primary care follow up.    Disposition:   Workup today is overall reassuring and patient is appropriate for discharge home at this time with PCP and cardiology follow-up.  Ambulatory referral to cardiology placed.  Patient given [facility] phone number to establish primary care.    The patient has been appropriately medically screened and/or stabilized in the ED. I have low suspicion for any other emergent medical  condition which would require further screening, evaluation or treatment in the ED or require inpatient management. At time of discharge the patient is hemodynamically stable and in no acute distress. I have discussed work-up results and diagnosis with patient and answered all questions. Patient is agreeable with discharge plan. We discussed strict return precautions for returning to the emergency department and they verbalized understanding.           Final Clinical Impression(s) / ED Diagnoses Final diagnoses:  Chest pain, unspecified type  Elevated serum creatinine    Rx / DC Orders ED Discharge Orders          Ordered    Ambulatory referral to Cardiology       Comments: If you have not heard from the Cardiology office within the next 72 hours please call [phone].   02/05/23 1547              Melton Alar R, PA-C 02/05/23 1740    Virgina Norfolk, DO 02/06/23 1048

## 2023-02-05 NOTE — ED Triage Notes (Signed)
Pt states resolved chest pain radiating down left arm that started on Saturday night  Denies SOB, Denies N/V

## 2023-03-20 ENCOUNTER — Ambulatory Visit: Payer: 59 | Admitting: Internal Medicine

## 2023-04-23 ENCOUNTER — Ambulatory Visit: Payer: 59 | Attending: Internal Medicine | Admitting: Internal Medicine

## 2023-04-23 VITALS — BP 126/88 | HR 78 | Ht 66.0 in | Wt 172.0 lb

## 2023-04-23 DIAGNOSIS — I10 Essential (primary) hypertension: Secondary | ICD-10-CM

## 2023-04-23 NOTE — Patient Instructions (Signed)
Medication Instructions:  No changes *If you need a refill on your cardiac medications before your next appointment, please call your pharmacy*  Follow-Up: At  [facility], you and your health needs are our priority.  As part of our continuing mission to provide you with exceptional heart care, we have created designated Provider Care Teams.  These Care Teams include your primary Cardiologist (physician) and Advanced Practice Providers (APPs -  Physician Assistants and Nurse Practitioners) who all work together to provide you with the care you need, when you need it.  We recommend signing up for the patient portal called "MyChart".  Sign up information is provided on this After Visit Summary.  MyChart is used to connect with patients for Virtual Visits (Telemedicine).  Patients are able to view lab/test results, encounter notes, upcoming appointments, etc.  Non-urgent messages can be sent to your provider as well.   To learn more about what you can do with MyChart, go to [email or web address].    Your next appointment:    Follow up as needed  Provider:   Dr Branch   

## 2023-04-23 NOTE — Progress Notes (Signed)
Cardiology Office Note:    Date:  04/23/2023   ID:  Desiree Price, DOB 1971/04/12, MRN [id number]  PCP:  Irena Reichmann, DO   [facility] Providers Cardiologist:  None     Referring MD: Irena Reichmann, DO   No chief complaint on file. Palpitations  History of Present Illness:    Desiree Price is a 52 y.o. female with a hx of GERD, referral from the ED for CP. Per their documentation " presents to the ED complaining of chest pain that radiated down her left arm and into her neck that began on Saturday night. She reports this chest pain has resolved. She does still have soreness in her neck. She reports she recently restarted Mounjaro at the highest dose a few days ago. The [facility] center she [facility] to started her on phentermine for a few weeks until [facility] was back in stock. She is not sure if her symptoms are related to this. She states she has poor water intake and drinks mainly [facility]. " She is also on phentermine. No persistent CP. She works for [employer]. She has occasional palpitations.   Past Medical History:  Diagnosis Date   Complication of anesthesia    GERD (gastroesophageal reflux disease)    Overweight    PONV (postoperative nausea and vomiting)    Pruritus     Past Surgical History:  Procedure Laterality Date   BREAST ENHANCEMENT SURGERY  2003   CHOLECYSTECTOMY N/A 07/30/2017   Procedure: LAPAROSCOPIC CHOLECYSTECTOMY WITH INTRAOPERATIVE CHOLANGIOGRAM;  Surgeon: Luretha Murphy, MD;  Location: [facility];  Service: General;  Laterality: N/A;   lipo suction      Current Medications: Current Outpatient Medications on File Prior to Visit  Medication Sig Dispense Refill   diphenhydrAMINE (BENADRYL) 25 mg capsule Take 50 mg by mouth every 8 (eight) hours as needed for itching or allergies.     phentermine 37.5 MG capsule Take 37.5 mg by mouth every morning.     ZEPBOUND 15 MG/0.5ML Pen Inject 15 mg into the skin once a week.     clobetasol cream (TEMOVATE)  0.05 % clobetasol 0.05 % topical cream     fluocinolone (VANOS) 0.01 % cream Apply 1 application topically 2 (two) times daily.     gabapentin (NEURONTIN) 600 MG tablet Take 600 mg by mouth at bedtime.     HYDROcodone-acetaminophen (NORCO) 5-325 MG tablet Take 1 tablet by mouth every 4 (four) hours as needed for moderate pain. 30 tablet 0   ondansetron (ZOFRAN-ODT) 4 MG disintegrating tablet PLACE 1 TABLET UNDER THE TONGUE EVERY 4 TO 6 HOURS AS NEEDED FOR NAUSEA AND VOMITING  0   pantoprazole (PROTONIX) 40 MG tablet Take 40 mg by mouth daily.  1   Tirbanibulin (KLISYRI) 1 % OINT Apply 1 application topically at bedtime. 1 each 0   No current facility-administered medications on file prior to visit.    Allergies:   No known allergies   Social History   Socioeconomic History   Marital status: Married    Spouse name: Not on file   Number of children: Not on file   Years of education: Not on file   Highest education level: Not on file  Occupational History   Not on file  Tobacco Use   Smoking status: Never   Smokeless tobacco: Never  Vaping Use   Vaping Use: Never used  Substance and Sexual Activity   Alcohol use: No   Drug use: No   Sexual  activity: Yes  Other Topics Concern   Not on file  Social History Narrative   Not on file   Social Determinants of Health   Financial Resource Strain: Not on file  Food Insecurity: Not on file  Transportation Needs: Not on file  Physical Activity: Not on file  Stress: Not on file  Social Connections: Not on file     Family History: The patient's family history includes CAD in her father; Diabetes in her father, maternal grandfather, paternal grandmother, and sister; Heart disease in her father; Hyperlipidemia in her father; Hypertension in her father, mother, and sister; Liver disease in her sister. No premature CAD  ROS:   Please see the history of present illness.     All other systems reviewed and are  negative.  EKGs/Labs/Other Studies Reviewed:    The following studies were reviewed today:  EKG Interpretation Date/Time:  Tuesday April 23 2023 15:43:36 EDT Ventricular Rate:  87 PR Interval:  120 QRS Duration:  84 QT Interval:  384 QTC Calculation: 462 R Axis:   82  Text Interpretation: Normal sinus rhythm Low voltage QRS Nonspecific T wave abnormality When compared with ECG of 05-Feb-2023 14:29, PREVIOUS ECG IS PRESENT Confirmed by Carolan Clines [phone]) on 04/23/2023 3:49:34 PM  EKG Interpretation Date/Time:  Tuesday April 23 2023 15:43:36 EDT Ventricular Rate:  87 PR Interval:  120 QRS Duration:  84 QT Interval:  384 QTC Calculation: 462 R Axis:   82  Text Interpretation: Normal sinus rhythm Low voltage QRS Nonspecific T wave abnormality When compared with ECG of 05-Feb-2023 14:29, PREVIOUS ECG IS PRESENT Confirmed by Carolan Clines ([id number]) on 04/23/2023 3:49:34 PM    Recent Labs: 02/05/2023: BUN 19; Creatinine, Ser 1.45; Hemoglobin 14.1; Platelets 386; Potassium 3.7; Sodium 135   Recent Lipid Panel    Component Value Date/Time   CHOL 223 (H) 04/19/2017 1609   TRIG 141 04/19/2017 1609   HDL 59 04/19/2017 1609   CHOLHDL 3.8 04/19/2017 1609     Risk Assessment/Calculations:     Physical Exam:    VS:  Vitals:   04/23/23 1537  BP: 126/88  Pulse: 78  SpO2: 96%     BP 126/88 (BP Location: Left Arm, Patient Position: Sitting, Cuff Size: Normal)   Pulse 78   Ht 5\' 6"  (1.676 m)   Wt 172 lb (78 kg)   LMP 03/29/2017   SpO2 96%   BMI 27.76 kg/m     Wt Readings from Last 3 Encounters:  04/23/23 172 lb (78 kg)  02/05/23 192 lb 0.3 oz (87.1 kg)  07/30/17 192 lb (87.1 kg)     GEN:  Well nourished, well developed in no acute distress HEENT: Normal NECK: No JVD; No carotid bruits LYMPHATICS: No lymphadenopathy CARDIAC: RRR, no murmurs, rubs, gallops RESPIRATORY:  Clear to auscultation without rales, wheezing or rhonchi  ABDOMEN: Soft, non-tender,  non-distended MUSCULOSKELETAL:  No edema; No deformity  SKIN: Warm and dry NEUROLOGIC:  Alert and oriented x 3 PSYCHIATRIC:  Normal affect   ASSESSMENT:    Palpitations Non cardiac CP - palpitations are benign - CP is not typical of cardiac CP; ACS was ruled out in the ED PLAN:    In order of problems listed above:  Non cardiac CP        Medication Adjustments/Labs and Tests Ordered: Current medicines are reviewed at length with the patient today.  Concerns regarding medicines are outlined above.  Orders Placed This Encounter  Procedures   EKG 12-Lead  No orders of the defined types were placed in this encounter.   Patient Instructions  Medication Instructions:  No changes *If you need a refill on your cardiac medications before your next appointment, please call your pharmacy*  Follow-Up: At [facility], you and your health needs are our priority.  As part of our continuing mission to provide you with exceptional heart care, we have created designated Provider Care Teams.  These Care Teams include your primary Cardiologist (physician) and Advanced Practice Providers (APPs -  Physician Assistants and Nurse Practitioners) who all work together to provide you with the care you need, when you need it.  We recommend signing up for the patient portal called "MyChart".  Sign up information is provided on this After Visit Summary.  MyChart is used to connect with patients for Virtual Visits (Telemedicine).  Patients are able to view lab/test results, encounter notes, upcoming appointments, etc.  Non-urgent messages can be sent to your provider as well.   To learn more about what you can do with MyChart, go to [email or web address].    Your next appointment:    Follow up as needed  Provider:   Dr Wyline Mood     Signed, Maisie Fus, MD  04/23/2023 4:59 PM    [facility]
